# Patient Record
Sex: Female | Born: 1955
Health system: Southern US, Community
[De-identification: ages and names within clinical notes are randomized; demographics above are authoritative.]

## PROBLEM LIST (undated history)

## (undated) DIAGNOSIS — F32A Depression, unspecified: Secondary | ICD-10-CM

## (undated) DIAGNOSIS — I10 Essential (primary) hypertension: Secondary | ICD-10-CM

## (undated) DIAGNOSIS — M545 Low back pain, unspecified: Secondary | ICD-10-CM

## (undated) DIAGNOSIS — Z923 Personal history of irradiation: Secondary | ICD-10-CM

## (undated) DIAGNOSIS — F329 Major depressive disorder, single episode, unspecified: Secondary | ICD-10-CM

## (undated) DIAGNOSIS — R251 Tremor, unspecified: Secondary | ICD-10-CM

## (undated) DIAGNOSIS — L570 Actinic keratosis: Secondary | ICD-10-CM

## (undated) DIAGNOSIS — E7800 Pure hypercholesterolemia, unspecified: Secondary | ICD-10-CM

## (undated) DIAGNOSIS — W19XXXA Unspecified fall, initial encounter: Secondary | ICD-10-CM

## (undated) DIAGNOSIS — C50919 Malignant neoplasm of unspecified site of unspecified female breast: Secondary | ICD-10-CM

## (undated) DIAGNOSIS — F419 Anxiety disorder, unspecified: Secondary | ICD-10-CM

## (undated) DIAGNOSIS — G4700 Insomnia, unspecified: Secondary | ICD-10-CM

## (undated) HISTORY — DX: Depression, unspecified: F32.A

## (undated) HISTORY — DX: Tremor, unspecified: R25.1

## (undated) HISTORY — DX: Pure hypercholesterolemia, unspecified: E78.00

## (undated) HISTORY — DX: Anxiety disorder, unspecified: F41.9

## (undated) HISTORY — DX: Insomnia, unspecified: G47.00

## (undated) HISTORY — PX: REDUCTION MAMMAPLASTY: SUR839

## (undated) HISTORY — DX: Major depressive disorder, single episode, unspecified: F32.9

## (undated) HISTORY — DX: Essential (primary) hypertension: I10

## (undated) HISTORY — DX: Unspecified fall, initial encounter: W19.XXXA

## (undated) HISTORY — PX: BREAST LUMPECTOMY: SHX2

## (undated) HISTORY — DX: Low back pain, unspecified: M54.50

## (undated) HISTORY — DX: Actinic keratosis: L57.0

---
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
## 2000-05-12 ENCOUNTER — Emergency Department (HOSPITAL_COMMUNITY): Admission: RE | Admit: 2000-05-12 | Discharge: 2000-05-12 | Payer: Self-pay | Admitting: *Deleted

## 2000-05-12 ENCOUNTER — Encounter: Payer: Self-pay | Admitting: *Deleted

## 2000-12-14 ENCOUNTER — Other Ambulatory Visit: Admission: RE | Admit: 2000-12-14 | Discharge: 2000-12-14 | Payer: Self-pay | Admitting: Family Medicine

## 2002-09-16 ENCOUNTER — Encounter: Payer: Self-pay | Admitting: Family Medicine

## 2002-09-16 ENCOUNTER — Ambulatory Visit (HOSPITAL_COMMUNITY): Admission: RE | Admit: 2002-09-16 | Discharge: 2002-09-16 | Payer: Self-pay | Admitting: Family Medicine

## 2002-12-19 ENCOUNTER — Other Ambulatory Visit: Admission: RE | Admit: 2002-12-19 | Discharge: 2002-12-19 | Payer: Self-pay | Admitting: Family Medicine

## 2003-06-03 ENCOUNTER — Ambulatory Visit (HOSPITAL_COMMUNITY): Admission: RE | Admit: 2003-06-03 | Discharge: 2003-06-03 | Payer: Self-pay | Admitting: Orthopedic Surgery

## 2004-10-25 ENCOUNTER — Other Ambulatory Visit: Admission: RE | Admit: 2004-10-25 | Discharge: 2004-10-25 | Payer: Self-pay | Admitting: Family Medicine

## 2004-11-27 ENCOUNTER — Emergency Department (HOSPITAL_COMMUNITY): Admission: EM | Admit: 2004-11-27 | Discharge: 2004-11-27 | Payer: Self-pay | Admitting: Emergency Medicine

## 2008-04-22 ENCOUNTER — Encounter: Admission: RE | Admit: 2008-04-22 | Discharge: 2008-04-22 | Payer: Self-pay | Admitting: Family Medicine

## 2008-10-15 HISTORY — PX: SHOULDER SURGERY: SHX246

## 2008-10-18 ENCOUNTER — Inpatient Hospital Stay (HOSPITAL_COMMUNITY): Admission: EM | Admit: 2008-10-18 | Discharge: 2008-10-21 | Payer: Self-pay | Admitting: Emergency Medicine

## 2008-10-29 ENCOUNTER — Encounter (INDEPENDENT_AMBULATORY_CARE_PROVIDER_SITE_OTHER): Payer: Self-pay | Admitting: Orthopedic Surgery

## 2008-10-29 ENCOUNTER — Ambulatory Visit: Payer: Self-pay | Admitting: Surgery

## 2008-10-29 ENCOUNTER — Ambulatory Visit (HOSPITAL_COMMUNITY): Admission: RE | Admit: 2008-10-29 | Discharge: 2008-10-30 | Payer: Self-pay | Admitting: Orthopedic Surgery

## 2010-05-21 LAB — DIFFERENTIAL
Lymphocytes Relative: 23 % (ref 12–46)
Lymphs Abs: 1.5 10*3/uL (ref 0.7–4.0)
Monocytes Relative: 6 % (ref 3–12)
Neutrophils Relative %: 68 % (ref 43–77)

## 2010-05-21 LAB — BASIC METABOLIC PANEL
BUN: 9 mg/dL (ref 6–23)
BUN: 11 mg/dL (ref 6–23)
BUN: 12 mg/dL (ref 6–23)
CO2: 27 mEq/L (ref 19–32)
Calcium: 8.9 mg/dL (ref 8.4–10.5)
Chloride: 106 mEq/L (ref 96–112)
Creatinine, Ser: 0.64 mg/dL (ref 0.4–1.2)
Creatinine, Ser: 0.62 mg/dL (ref 0.4–1.2)
Creatinine, Ser: 0.66 mg/dL (ref 0.4–1.2)
GFR calc Af Amer: >60 mL/min (ref >60)
GFR calc non Af Amer: >60 mL/min (ref >60)
GFR calc non Af Amer: >60 mL/min (ref >60)
GFR calc non Af Amer: >60 mL/min (ref >60)
Glucose, Bld: 83 mg/dL (ref 70–99)
Glucose, Bld: 96 mg/dL (ref 70–99)
Potassium: 3.9 mEq/L (ref 3.5–5.1)
Potassium: 4.2 mEq/L (ref 3.5–5.1)

## 2010-05-21 LAB — PROTIME-INR
INR: 0.9 (ref 0.00–1.49)
Prothrombin Time: 12.3 seconds (ref 11.6–15.2)

## 2010-05-21 LAB — CBC
HCT: 33.4 % — ABNORMAL LOW (ref 36.0–46.0)
HCT: 33.6 % — ABNORMAL LOW (ref 36.0–46.0)
MCV: 107.7 fL — ABNORMAL HIGH (ref 78.0–100.0)
Platelets: 637 10*3/uL — ABNORMAL HIGH (ref 150–400)
Platelets: 657 10*3/uL — ABNORMAL HIGH (ref 150–400)
RBC: 3.12 MIL/uL — ABNORMAL LOW (ref 3.87–5.11)
RDW: 13.9 % (ref 11.5–15.5)
WBC: 8 10*3/uL (ref 4.0–10.5)
WBC: 6.5 10*3/uL (ref 4.0–10.5)

## 2010-05-21 LAB — TYPE AND SCREEN
ABO/RH(D): O NEG
Antibody Screen: NEGATIVE

## 2010-05-21 LAB — APTT: aPTT: 30 seconds (ref 24–37)

## 2010-07-02 NOTE — Consult Note (Signed)
NAMELORRANE, MCCAY NO.:  [id number]   MEDICAL RECORD NO.:  [id number]          PATIENT TYPE:  EMS   LOCATION:  MAJO                         FACILITY:  [facility]   PHYSICIAN:  Dionne Ano. Gramig III, M.D.DATE OF BIRTH:  05/07/1965   DATE OF CONSULTATION:  11/27/2004  DATE OF DISCHARGE:  11/27/2004                                   CONSULTATION   I had the pleasure to see Michele Swanson in the emergency room following  the kind referral from the Emergency Room Physician, Dr. Cathren Laine.  Ms.  [name] is a 55 year old right-hand dominant female who sustained an injury  to the left ring finger when her dog pulled her down.  She sustained a  dislocation of her finger, it became caught in a chain, twisted, and she  presented today with a highly deformed finger.  She denies other injury.   PAST MEDICAL HISTORY:  Depression and hypertension.   CURRENT MEDICATIONS:  1.  Cymbalta.  2.  Altace.   PAST SURGICAL HISTORY:  Cervical surgery (GYN surgery).   ALLERGIES:  None.   SOCIAL HISTORY:  The patient is separated, she smokes half pack of day, she  occasionally consumes alcohol.   EXAMINATION:  Reveals a female who is alert and oriented, no acute distress.  Vital signs stable.  The patient has a deformed finger about the ring finger  PIP joint, this is painful.  She has normal refill.  It is difficult to  assess her tendon exam due to pain.   I have gone ahead and performed an intermetacarpal block with lidocaine, she  tolerated this well, there were no complicating features.  Following this I  performed a manipulative reduction of the finger, she manipulated nicely  back into a reduced position.  Post reduction x-rays revealed a volar lip  fracture consistent with the dislocation.  She was stable in extension at 30  degrees, she had radial collateral ligament insufficiency indicating the  correct diagnosis of dislocation secondary to radial collateral ligament  tear.   The patient tolerated the procedure well.  Post reduction x-rays looked  excellent, except for the volar lip fracture which we will treat.  We are  going to place her in a dorsal blocking splint at night, beginning of range  of motion, buddy taping the ring finger to the middle finger and we  discharged her on Percocet and Robaxin.  She will return to the office to  see [place] next week for AP and lateral x-rays, and we will proceed accordingly.  It has been a pleasure to see her today, all questions have been encouraged  and answered.           ______________________________  Dionne Ano. Everlene Other, M.D.    Nash Mantis  D:  11/27/2004  T:  11/27/2004  Job:  [id number]

## 2014-01-13 ENCOUNTER — Other Ambulatory Visit: Payer: Self-pay | Admitting: Family Medicine

## 2014-01-13 ENCOUNTER — Other Ambulatory Visit (HOSPITAL_COMMUNITY)
Admission: RE | Admit: 2014-01-13 | Discharge: 2014-01-13 | Disposition: A | Discharge status: Home / Self Care | Payer: 59 | Source: Ambulatory Visit | Attending: Family Medicine | Admitting: Family Medicine

## 2014-01-13 DIAGNOSIS — Z01419 Encounter for gynecological examination (general) (routine) without abnormal findings: Secondary | ICD-10-CM | POA: Diagnosis present

## 2014-01-14 LAB — CYTOLOGY - PAP

## 2015-10-22 ENCOUNTER — Encounter: Payer: Self-pay | Admitting: *Deleted

## 2015-10-23 ENCOUNTER — Encounter: Payer: Self-pay | Admitting: Diagnostic Neuroimaging

## 2015-10-23 ENCOUNTER — Ambulatory Visit (INDEPENDENT_AMBULATORY_CARE_PROVIDER_SITE_OTHER): Payer: No Typology Code available for payment source | Admitting: Diagnostic Neuroimaging

## 2015-10-23 VITALS — BP 111/76 | HR 86 | Ht 65.0 in | Wt 143.0 lb

## 2015-10-23 DIAGNOSIS — R251 Tremor, unspecified: Secondary | ICD-10-CM | POA: Diagnosis not present

## 2015-10-23 DIAGNOSIS — F411 Generalized anxiety disorder: Secondary | ICD-10-CM

## 2015-10-23 NOTE — Progress Notes (Signed)
[facility]  PATIENT: Michele Swanson DOB: 06-20-1955  REFERRING CLINICIAN: Cipriano Mile HISTORY FROM: patient  REASON FOR VISIT: new consult    HISTORICAL  CHIEF COMPLAINT:  Chief Complaint  Patient presents with  . Tremors    rm 7, New Pt, "tremors x 2-3 mos, especially when trying to do something like write; may be due to Benicar?"    HISTORY OF PRESENT ILLNESS:   60 year old right-handed female here for evaluation of tremors. Patient reports at least 2-3 months of increasing tremors when holding a cup, holding a sandwich or doing other activities with her hands. Patient does not have any significant resting tremor. She reports mainly postural and action tremor. Symptoms have been present and a mild and intermittently for approximately one year but more significantly in the last 2-3 months. She denies any head, voice, lower extremity tremor. She denies any balance difficulty, choking, change in voice, change in sense of smell or taste. She does have a longer standing depression, anxiety, insomnia since the year 2000.  No family history of tremor except for maternal aunt with Parkinson's disease.   REVIEW OF SYSTEMS: Full 14 system review of systems performed and negative with exception of: Tremor easy bruising depression anxiety insomnia snoring allergies.  ALLERGIES: Allergies  Allergen Reactions  . Ambien [Zolpidem Tartrate] Other (See Comments)    Syncope episode  . Voltaren [Diclofenac Sodium] Nausea Only    HOME MEDICATIONS: No outpatient prescriptions prior to visit.   No facility-administered medications prior to visit.     PAST MEDICAL HISTORY: Past Medical History:  Diagnosis Date  . Anxiety   . Depression   . Hypercholesterolemia   . Hypertension   . Insomnia     PAST SURGICAL HISTORY: Past Surgical History:  Procedure Laterality Date  . SHOULDER SURGERY Right 10/2008   pinning    FAMILY HISTORY: Family History  Problem  Relation Age of Onset  . Dementia Mother   . Heart disease Father   . Atrial fibrillation Brother   . Heart disease Paternal Grandmother     SOCIAL HISTORY:  Social History   Social History  . Marital status: Married    Spouse name: N/A  . Number of children: 0  . Years of education: 12   Occupational History  .      retired, Warehouse manager   Social History Main Topics  . Smoking status: Current Every Day Smoker    Packs/day: 0.50    Types: Cigarettes  . Smokeless tobacco: Never Used     Comment: 10/23/15 trying to cut back  . Alcohol use No     Comment: socially  . Drug use: No  . Sexual activity: Not on file   Other Topics Concern  . Not on file   Social History Narrative   Lives alone, but is married   Caffeine - Coke, 1 daily     PHYSICAL EXAM  GENERAL EXAM/CONSTITUTIONAL: Vitals:  Vitals:   10/23/15 0935  BP: 111/76  Pulse: 86  Weight: 143 lb (64.9 kg)  Height: '5\' 5"'  (1.651 m)     Body mass index is 23.8 kg/m.  Visual Acuity Screening   Right eye Left eye Both eyes  Without correction: 20/70 20/70   With correction:     Comments: 10/23/15 didn't have glasses, &quot;need to see eye dr again&quot;    Patient is in no distress; well developed, nourished and groomed; neck is supple  CARDIOVASCULAR:  Examination of carotid arteries  is normal; no carotid bruits  Regular rate and rhythm, no murmurs  Examination of peripheral vascular system by observation and palpation is normal  EYES:  Ophthalmoscopic exam of optic discs and posterior segments is normal; no papilledema or hemorrhages  MUSCULOSKELETAL:  Gait, strength, tone, movements noted in Neurologic exam below  NEUROLOGIC: MENTAL STATUS:  No flowsheet data found.  awake, alert, oriented to person, place and time  recent and remote memory intact  normal attention and concentration  language fluent, comprehension intact, naming intact,   fund of knowledge  appropriate  CRANIAL NERVE:   2nd - no papilledema on fundoscopic exam  2nd, 3rd, 4th, 6th - pupils equal and reactive to light, visual fields full to confrontation, extraocular muscles intact, no nystagmus  5th - facial sensation symmetric  7th - facial strength symmetric  8th - hearing intact  9th - palate elevates symmetrically, uvula midline  11th - shoulder shrug symmetric  12th - tongue protrusion midline  MOTOR:   normal bulk and tone, full strength in the BUE, BLE  POSTURAL AND ACTION TREMOR IN BUE  MILD HEAD AND VOICE TREMOR  RARE REST TREMOR IN LUE  SENSORY:   normal and symmetric to light touch, temperature, vibration  COORDINATION:   finger-nose-finger, fine finger movements normal  REFLEXES:   deep tendon reflexes present and symmetric; SLIGHTLY BRISK IN LEGS   GAIT/STATION:   narrow based gait; able to walk tandem; romberg is negative    DIAGNOSTIC DATA (LABS, IMAGING, TESTING) - I reviewed patient records, labs, notes, testing and imaging myself where available.  Lab Results  Component Value Date   WBC 8.0 10/30/2008   HGB 11.4 (L) 10/30/2008   HCT 33.4 (L) 10/30/2008   MCV 108.0 (H) 10/30/2008   PLT 637 (H) 10/30/2008      Component Value Date/Time   NA 140 10/30/2008 0610   K 3.9 10/30/2008 0610   CL 107 10/30/2008 0610   CO2 25 10/30/2008 0610   GLUCOSE 83 10/30/2008 0610   BUN 9 10/30/2008 0610   CREATININE 0.64 10/30/2008 0610   CALCIUM 8.8 10/30/2008 0610   GFRNONAA >60 10/30/2008 0610   GFRAA  10/30/2008 0610    >60        The eGFR has been calculated using the MDRD equation. This calculation has not been validated in all clinical situations. eGFR's persistently <60 mL/min signify possible Chronic Kidney Disease.   No results found for: CHOL, HDL, LDLCALC, LDLDIRECT, TRIG, CHOLHDL No results found for: HGBA1C No results found for: VITAMINB12 No results found for: TSH      ASSESSMENT AND PLAN  60 y.o.  year old female here with gradual onset progressive postural and action tremor for past 1 year. Signs and symptoms most consistent with essential tremor versus metabolic or enhanced physiologic tremor. No rigidity, bradykinesia, postural instability to suggest Parkinson's disease at this time. Tremor is not significantly bothering patient at the present time with her activities of daily living or social life. Will proceed with further testing and monitor symptoms.   Ddx: essential tremor vs metabolic vs enhanced physiologic tremor  1. Tremor   2. Generalized anxiety disorder      PLAN: - add'l testing - may consider primidone in future for tremor control; will hold off for now as patient not that bothered by tremor and able to perform all ADLs at this time  - may consider sleep study in future for snoring, daytime fatigue and hypertension  Orders Placed This Encounter  Procedures  . MR Brain Wo Contrast  . TSH  . Vitamin B12   Return in about 3 months (around 01/22/2016).     Penni Bombard, MD 8/0/6386, 85:48 AM Certified in Neurology, Neurophysiology and Neuroimaging  [facility] [address] [place], [place] [address] [phone]

## 2015-10-23 NOTE — Patient Instructions (Addendum)

## 2015-10-24 LAB — TSH: TSH: 2.1 u[IU]/mL (ref 0.450–4.500)

## 2015-10-24 LAB — VITAMIN B12: Vitamin B-12: 293 pg/mL (ref 211–946)

## 2015-10-27 ENCOUNTER — Telehealth: Payer: Self-pay | Admitting: *Deleted

## 2015-10-27 NOTE — Telephone Encounter (Signed)
Per Dr Marjory LiesPenumalli, spoke with patient and informed her that her lab results are good. Advised her Dr Marjory LiesPenumalli will continue with her current treatment plan. Advised she will get a call to schedule MRI after insurance process is complete. She verbalized understanding, appreciation.

## 2015-11-06 ENCOUNTER — Telehealth: Payer: Self-pay | Admitting: Diagnostic Neuroimaging

## 2015-11-06 NOTE — Telephone Encounter (Signed)
Spoke with the patient regarding her insurance. She stated that she has new insurance through [employer], she said that they would not give her a card. She stated that the ID number was [id number]. Told her that I would try that number and call her back once I have an approval.

## 2015-12-08 NOTE — Telephone Encounter (Signed)
Per order notes patient is going to hold off on scheduling MRI due to financial reasons.

## 2015-12-15 ENCOUNTER — Telehealth: Payer: Self-pay | Admitting: Diagnostic Neuroimaging

## 2015-12-15 NOTE — Telephone Encounter (Signed)
Pt canceled 12/12 states waiting until new ins to r/s another appt.  cb

## 2015-12-16 NOTE — Telephone Encounter (Signed)
Noted. -VRP 

## 2016-01-26 ENCOUNTER — Ambulatory Visit: Payer: No Typology Code available for payment source | Admitting: Diagnostic Neuroimaging

## 2016-06-27 ENCOUNTER — Other Ambulatory Visit: Payer: Self-pay | Admitting: Family Medicine

## 2016-06-27 ENCOUNTER — Other Ambulatory Visit (HOSPITAL_COMMUNITY)
Admission: RE | Admit: 2016-06-27 | Discharge: 2016-06-27 | Disposition: A | Discharge status: Home / Self Care | Payer: No Typology Code available for payment source | Source: Ambulatory Visit | Attending: Family Medicine | Admitting: Family Medicine

## 2016-06-27 DIAGNOSIS — Z124 Encounter for screening for malignant neoplasm of cervix: Secondary | ICD-10-CM | POA: Diagnosis not present

## 2016-06-28 LAB — CYTOLOGY - PAP: Diagnosis: NEGATIVE

## 2018-07-25 ENCOUNTER — Other Ambulatory Visit: Payer: Self-pay | Admitting: Family Medicine

## 2018-07-25 DIAGNOSIS — E2839 Other primary ovarian failure: Secondary | ICD-10-CM

## 2019-01-24 ENCOUNTER — Encounter: Payer: Self-pay | Admitting: *Deleted

## 2019-01-25 ENCOUNTER — Other Ambulatory Visit: Payer: Self-pay

## 2019-01-25 ENCOUNTER — Encounter: Payer: Self-pay | Admitting: Diagnostic Neuroimaging

## 2019-01-25 ENCOUNTER — Ambulatory Visit: Payer: Medicare Other | Admitting: Diagnostic Neuroimaging

## 2019-01-25 VITALS — BP 123/70 | HR 90 | Temp 97.0°F | Ht 65.5 in | Wt 139.1 lb

## 2019-01-25 DIAGNOSIS — G252 Other specified forms of tremor: Secondary | ICD-10-CM

## 2019-01-25 DIAGNOSIS — R269 Unspecified abnormalities of gait and mobility: Secondary | ICD-10-CM | POA: Diagnosis not present

## 2019-01-25 NOTE — Progress Notes (Signed)
[facility]  PATIENT: Michele Swanson DOB: 1955/11/22  REFERRING CLINICIAN: Cipriano Swanson HISTORY FROM: patient  REASON FOR VISIT: new consult    HISTORICAL  CHIEF COMPLAINT:  Chief Complaint  Patient presents with  . Follow-up    Rm 7 pt's last visit was in 2017 for tremor- Pt has been referred back by Michele Ada, MD for the same issue  . Tremors    Pt reports tremors bilateral hands, reports stress and anxiety makes the tremors worse.  . Fall    Reports an increase in falls- ambulates with walker at all time.  . Back Pain    Lower back pain- pain with radiate to lower leg.     HISTORY OF PRESENT ILLNESS:   UPDATE (01/25/19, VRP): 63 year old female with gait diff and tremor. Since last visit, tremor slightly worse. More unsteadiness. Now with more LBP radiating to left leg; sometimes leg gives out. Some falls. Now using walker x 1 week. Poor appetite, eating once per day. Poor sleep.  PRIOR HPI (10/23/15): 63 year old right-handed female here for evaluation of tremors. Patient reports at least 2-3 months of increasing tremors when holding a cup, holding a sandwich or doing other activities with her hands. Patient does not have any significant resting tremor. She reports mainly postural and action tremor. Symptoms have been present and a mild and intermittently for approximately one year but more significantly in the last 2-3 months. She denies any head, voice, lower extremity tremor. She denies any balance difficulty, choking, change in voice, change in sense of smell or taste. She does have a longer standing depression, anxiety, insomnia since the year 2000.  No family history of tremor except for maternal aunt with Parkinson's disease.   REVIEW OF SYSTEMS: Full 14 system review of systems performed and negative with exception of: as per HPI.  ALLERGIES: Allergies  Allergen Reactions  . Ambien [Zolpidem Tartrate] Other (See Comments)    Syncope episode  .  Hydrocodone-Acetaminophen     Itching all over  . Voltaren [Diclofenac Sodium] Nausea Only    HOME MEDICATIONS: Outpatient Medications Prior to Visit  Medication Sig Dispense Refill  . amLODipine (NORVASC) 5 MG tablet Take 5 mg by mouth daily.    . Eszopiclone (ESZOPICLONE) 3 MG TABS Take 3 mg by mouth at bedtime. Take immediately before bedtime    . hydrochlorothiazide (HYDRODIURIL) 12.5 MG tablet Take 12.5 mg by mouth daily.    . Irbesartan (AVAPRO PO) irbesartan    . venlafaxine XR (EFFEXOR-XR) 150 MG 24 hr capsule venlafaxine ER 150 mg capsule,extended release 24 hr  Take 1 capsule every day by oral route.    Marland Kitchen amLODipine (NORVASC) 10 MG tablet Take 5 mg by mouth daily.     . Eszopiclone 3 MG TABS eszopiclone 3 mg tablet  Take 1 tablet every day by oral route.    . folic acid (FOLVITE) 097 MCG tablet Take 400 mcg by mouth daily.    . irbesartan (AVAPRO) 150 MG tablet Take 150 mg by mouth daily.    Marland Kitchen olmesartan (BENICAR) 20 MG tablet Take 20 mg by mouth daily.    Marland Kitchen venlafaxine XR (EFFEXOR-XR) 150 MG 24 hr capsule Take 150 mg by mouth daily with breakfast.    . vitamin B-12 (CYANOCOBALAMIN) 1000 MCG tablet Take 1,000 mcg by mouth daily.     No facility-administered medications prior to visit.    PAST MEDICAL HISTORY: Past Medical History:  Diagnosis Date  . Anxiety   .  Depression   . Fall   . Hypercholesterolemia   . Hypertension   . Insomnia   . Low back pain   . Tremor     PAST SURGICAL HISTORY: Past Surgical History:  Procedure Laterality Date  . SHOULDER SURGERY Right 10/2008   pinning    FAMILY HISTORY: Family History  Problem Relation Age of Onset  . Dementia Mother   . Heart disease Father   . Atrial fibrillation Brother   . Heart disease Paternal Grandmother     SOCIAL HISTORY:  Social History   Socioeconomic History  . Marital status: Married    Spouse name: Not on file  . Number of children: 0  . Years of education: 70  . 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 Smoking status: Current Every Day Smoker    Packs/day: 0.50    Types: Cigarettes  . Smokeless tobacco: Never Used  . Tobacco comment: 10/23/15 trying to cut back  Substance and Sexual Activity  . Alcohol use: No    Comment: socially  . Drug use: No  . Sexual activity: Not on file  Other Topics Concern  . Not on file  Social History Narrative   Lives alone, but is married   Caffeine - Coke, 1 daily   Right handed    Social Determinants of Health   Financial Resource Strain:   . Difficulty of Paying Living Expenses: Not on file  Food Insecurity:   . Worried About Charity fundraiser in the Last Year: Not on file  . Ran Out of Food in the Last Year: Not on file  Transportation Needs:   . Lack of Transportation (Medical): Not on file  . Lack of Transportation (Non-Medical): Not on file  Physical Activity:   . Days of Exercise per Week: Not on file  . Minutes of Exercise per Session: Not on file  Stress:   . Feeling of Stress : Not on file  Social Connections:   . Frequency of Communication with Friends and Family: Not on file  . Frequency of Social Gatherings with Friends and Family: Not on file  . Attends Religious Services: Not on file  . Active Member of Clubs or Organizations: Not on file  . Attends Archivist Meetings: Not on file  . Marital Status: Not on file  Intimate Partner Violence:   . Fear of Current or Ex-Partner: Not on file  . Emotionally Abused: Not on file  . Physically Abused: Not on file  . Sexually Abused: Not on file     PHYSICAL EXAM  GENERAL EXAM/CONSTITUTIONAL: Vitals:  Vitals:   01/25/19 0809  BP: 123/70  Pulse: 90  Temp: (!) 97 F (36.1 C)  TempSrc: Temporal  Weight: 139 lb 2 oz (63.1 kg)  Height: 5' 5.5" (1.664 m)   Body mass index is 22.8 kg/m. No exam data present  Patient is in no distress; well developed, nourished  and groomed; neck is supple  CARDIOVASCULAR:  Examination of carotid arteries is normal; no carotid bruits  Regular rate and rhythm, no murmurs  Examination of peripheral vascular system by observation and palpation is normal  EYES:  Ophthalmoscopic exam of optic discs and posterior segments is normal; no papilledema or hemorrhages  MUSCULOSKELETAL:  Gait, strength, tone, movements noted in Neurologic exam below  NEUROLOGIC: MENTAL STATUS:  No flowsheet data found.  awake, alert, oriented to person,  place and time  recent and remote memory intact  normal attention and concentration  language fluent, comprehension intact, naming intact,   fund of knowledge appropriate  CRANIAL NERVE:   2nd - no papilledema on fundoscopic exam  2nd, 3rd, 4th, 6th - pupils equal and reactive to light, visual fields full to confrontation, extraocular muscles intact, no nystagmus  5th - facial sensation symmetric  7th - facial strength symmetric  8th - hearing intact  9th - palate elevates symmetrically, uvula midline  11th - shoulder shrug symmetric  12th - tongue protrusion midline  MOTOR:   normal bulk and tone, full strength in the BUE, BLE  POSTURAL AND ACTION TREMOR IN BUE  MILD HEAD AND VOICE TREMOR  NO REST TREMOR; NO BRADYKINESIA; NO RIGIDITY  SENSORY:   normal and symmetric to light touch, temperature, vibration  COORDINATION:   finger-nose-finger, fine finger movements normal  REFLEXES:   deep tendon reflexes --> SLIGHTLY BRISK IN LUE AND BLE; NEG HOFFMANS  GAIT/STATION:   WIDE BASED GAIT; VERY UNSTEADY / ATAXIC GAIT    DIAGNOSTIC DATA (LABS, IMAGING, TESTING) - I reviewed patient records, labs, notes, testing and imaging myself where available.  Lab Results  Component Value Date   WBC 8.0 10/30/2008   HGB 11.4 (L) 10/30/2008   HCT 33.4 (L) 10/30/2008   MCV 108.0 (H) 10/30/2008   PLT 637 (H) 10/30/2008      Component Value Date/Time   NA  140 10/30/2008 0610   K 3.9 10/30/2008 0610   CL 107 10/30/2008 0610   CO2 25 10/30/2008 0610   GLUCOSE 83 10/30/2008 0610   BUN 9 10/30/2008 0610   CREATININE 0.64 10/30/2008 0610   CALCIUM 8.8 10/30/2008 0610   GFRNONAA >60 10/30/2008 0610   GFRAA  10/30/2008 0610    >60        The eGFR has been calculated using the MDRD equation. This calculation has not been validated in all clinical situations. eGFR's persistently <60 mL/min signify possible Chronic Kidney Disease.   No results found for: CHOL, HDL, LDLCALC, LDLDIRECT, TRIG, CHOLHDL No results found for: HGBA1C Lab Results  Component Value Date   VITAMINB12 293 10/23/2015   Lab Results  Component Value Date   TSH 2.100 10/23/2015        ASSESSMENT AND PLAN  63 y.o. year old female here with:    Dx:   1. Gait difficulty   2. Postural tremor    PLAN:  GAIT DIFFICULTY (frequent falls; low back pain radiating to left leg) - check MRI cervical and lumbar spine (rule out cervical myelopathy and lumbar spinal stenosis) - check labs (b12, tsh, a1c) - may consider MRI brain in future - refer to PT evaluation  POSTURAL / ACTION TREMOR (essential tremor vs anxiety tremor vs medication tremor) - may consider primidone in future for tremor control  Orders Placed This Encounter  Procedures  . MR CERVICAL SPINE WO CONTRAST  . MR LUMBAR SPINE WO CONTRAST  . Vitamin B12  . Hemoglobin A1c  . TSH  . CBC with Differential/Platelet  . Comprehensive metabolic panel  . Home Health  . Face-to-face encounter (required for Medicare/Medicaid patients)   Return pending test results.     Penni Bombard, MD 24/23/5361, 4:43 AM Certified in Neurology, Neurophysiology and Neuroimaging  [facility] [address], [place], [place] [address] [phone]

## 2019-01-25 NOTE — Patient Instructions (Signed)
GAIT DIFFICULTY - check MRI cervical and lumbar spine  - check labs (b12, tsh, a1c) - may consider MRI brain in future - refer to PT evaluation  POSTURAL / ACTION TREMOR (essential tremor vs anxiety tremor vs medication tremor) - may consider primidone in future for tremor control

## 2019-01-25 NOTE — Addendum Note (Signed)
Addended by: Inis Sizer D on: 01/25/2019 09:21 AM   Modules accepted: Orders

## 2019-01-26 LAB — COMPREHENSIVE METABOLIC PANEL
ALT: 10 IU/L (ref 0–32)
AST: 12 IU/L (ref 0–40)
Albumin/Globulin Ratio: 1.7 (ref 1.2–2.2)
Albumin: 3.9 g/dL (ref 3.8–4.8)
Alkaline Phosphatase: 118 IU/L — ABNORMAL HIGH (ref 39–117)
BUN/Creatinine Ratio: 28 (ref 12–28)
BUN: 21 mg/dL (ref 8–27)
Bilirubin Total: 0.2 mg/dL (ref 0.0–1.2)
CO2: 23 mmol/L (ref 20–29)
Calcium: 9.5 mg/dL (ref 8.7–10.3)
Chloride: 103 mmol/L (ref 96–106)
Creatinine, Ser: 0.76 mg/dL (ref 0.57–1.00)
GFR calc Af Amer: 95 mL/min/{1.73_m2} (ref >59)
GFR calc non Af Amer: 83 mL/min/{1.73_m2} (ref >59)
Globulin, Total: 2.3 g/dL (ref 1.5–4.5)
Glucose: 104 mg/dL — ABNORMAL HIGH (ref 65–99)
Potassium: 4.5 mmol/L (ref 3.5–5.2)
Sodium: 142 mmol/L (ref 134–144)
Total Protein: 6.2 g/dL (ref 6.0–8.5)

## 2019-01-26 LAB — CBC WITH DIFFERENTIAL/PLATELET
Basophils Absolute: 0 10*3/uL (ref 0.0–0.2)
Basos: 0 %
EOS (ABSOLUTE): 0 10*3/uL (ref 0.0–0.4)
Eos: 0 %
Hematocrit: 38.7 % (ref 34.0–46.6)
Hemoglobin: 13.2 g/dL (ref 11.1–15.9)
Immature Grans (Abs): 0 10*3/uL (ref 0.0–0.1)
Immature Granulocytes: 0 %
Lymphocytes Absolute: 1.3 10*3/uL (ref 0.7–3.1)
Lymphs: 14 %
MCH: 35.3 pg — ABNORMAL HIGH (ref 26.6–33.0)
MCHC: 34.1 g/dL (ref 31.5–35.7)
MCV: 104 fL — ABNORMAL HIGH (ref 79–97)
Monocytes Absolute: 0.6 10*3/uL (ref 0.1–0.9)
Monocytes: 7 %
Neutrophils Absolute: 7.4 10*3/uL — ABNORMAL HIGH (ref 1.4–7.0)
Neutrophils: 79 %
Platelets: 324 10*3/uL (ref 150–450)
RBC: 3.74 x10E6/uL — ABNORMAL LOW (ref 3.77–5.28)
RDW: 12.8 % (ref 11.7–15.4)
WBC: 9.4 10*3/uL (ref 3.4–10.8)

## 2019-01-26 LAB — TSH: TSH: 2.23 u[IU]/mL (ref 0.450–4.500)

## 2019-01-26 LAB — HEMOGLOBIN A1C
Est. average glucose Bld gHb Est-mCnc: 108 mg/dL
Hgb A1c MFr Bld: 5.4 % (ref 4.8–5.6)

## 2019-01-26 LAB — VITAMIN B12: Vitamin B-12: 1642 pg/mL — ABNORMAL HIGH (ref 232–1245)

## 2019-01-29 ENCOUNTER — Telehealth: Payer: Self-pay | Admitting: Diagnostic Neuroimaging

## 2019-01-29 NOTE — Telephone Encounter (Signed)
01/29/2019 Called patient's husband and he relayed that patient has switched to [facility] Patient husband relayed he will call me back with information 02/04/2019  I need to check for PA for MRI's Jayme Cloud

## 2019-02-04 ENCOUNTER — Telehealth: Payer: Self-pay | Admitting: *Deleted

## 2019-02-04 NOTE — Telephone Encounter (Signed)
Pts husband called and stated January 2020 pt will be switching to [facility]  Member ID : [id number]  Group ID : [id number]

## 2019-02-04 NOTE — Telephone Encounter (Signed)
Spoke with patient and informed her that her labs are unremarkable . Continue current plan of MRIs in Jan. Patient verbalized understanding, appreciation.

## 2019-02-14 ENCOUNTER — Other Ambulatory Visit: Payer: Self-pay | Admitting: Diagnostic Neuroimaging

## 2019-02-16 ENCOUNTER — Other Ambulatory Visit: Payer: No Typology Code available for payment source

## 2019-02-18 NOTE — Telephone Encounter (Signed)
I called to check status of the patients authorization request, the representative stated that the Lumbar has been approved and the Cervical is pending clinical review. I spoke with nurse reviewer who approved the cases Authorization [id number] (Feb 3rd 2021).   I called the patients husband to make him aware but he did not answer so I left him a VM asking him to call me back. DWD

## 2019-02-18 NOTE — Telephone Encounter (Signed)
I called the patients husband and made him aware. DWD

## 2019-02-18 NOTE — Telephone Encounter (Signed)
I have submitted pre-auth for MRI's ordered by Dr. Marjory Lies and they are requiring clinical review. I have faxed clinical notes to [facility] at [phone]. Phone: [phone], Tracking# [id number].

## 2019-02-22 ENCOUNTER — Ambulatory Visit
Admission: RE | Admit: 2019-02-22 | Discharge: 2019-02-22 | Disposition: A | Discharge status: Home / Self Care | Payer: Medicare PPO | Source: Ambulatory Visit | Attending: Diagnostic Neuroimaging | Admitting: Diagnostic Neuroimaging

## 2019-02-22 ENCOUNTER — Other Ambulatory Visit: Payer: Self-pay

## 2019-02-22 DIAGNOSIS — R269 Unspecified abnormalities of gait and mobility: Secondary | ICD-10-CM | POA: Diagnosis not present

## 2019-03-05 ENCOUNTER — Telehealth: Payer: Self-pay | Admitting: *Deleted

## 2019-03-05 DIAGNOSIS — R269 Unspecified abnormalities of gait and mobility: Secondary | ICD-10-CM

## 2019-03-05 DIAGNOSIS — M545 Low back pain, unspecified: Secondary | ICD-10-CM

## 2019-03-05 DIAGNOSIS — R937 Abnormal findings on diagnostic imaging of other parts of musculoskeletal system: Secondary | ICD-10-CM

## 2019-03-05 DIAGNOSIS — R296 Repeated falls: Secondary | ICD-10-CM

## 2019-03-05 NOTE — Telephone Encounter (Addendum)
Reached patient and informed her MRI cervical spine showed mild degenerative / arthritis changes in her neck. He advises to continue conservative management. Her MRI lumbar spine showed mild-moderate spinal stenosis at L3-4, L4-5. He advised she consider spine surgery evaluation / consult. She will discuss with husband and let us know. Patient verbalized understanding, appreciation.

## 2019-03-05 NOTE — Telephone Encounter (Signed)
LVM requesting call back for MRI results. 

## 2019-03-05 NOTE — Telephone Encounter (Signed)
Patient called back in regards to missed call for results.  Please follow up 

## 2019-03-06 ENCOUNTER — Telehealth: Payer: Self-pay | Admitting: Diagnostic Neuroimaging

## 2019-03-06 NOTE — Telephone Encounter (Addendum)
Patient's husband called wanting to know what the next step is now that the patient has Spinal Stenosis. I called husband, Ron who had multiple questions. I answered to his stated satisfaction. The patient and husband would like her to be referred to a spine surgeon. I advised Dr Marjory Lies will put in referral. If they haven't heard in a week they may call to check on referral. He verbalized understanding, appreciation. Referral placed.

## 2019-03-06 NOTE — Telephone Encounter (Signed)
Please see phone note dated 03/05/19.

## 2019-03-06 NOTE — Addendum Note (Signed)
Addended by: [place] Pink on: 03/06/2019 04:21 PM   Modules accepted: Orders

## 2019-03-06 NOTE — Telephone Encounter (Signed)
Pt's husband called wanting to know what the next step is now that it is diagnosed that the pt has Spinal Stenosis. Please advise.

## 2019-03-12 NOTE — Telephone Encounter (Signed)
Noted Referral has been faxed.

## 2019-04-16 ENCOUNTER — Other Ambulatory Visit: Payer: Self-pay | Admitting: Neurosurgery

## 2019-04-16 DIAGNOSIS — R269 Unspecified abnormalities of gait and mobility: Secondary | ICD-10-CM

## 2019-05-11 ENCOUNTER — Ambulatory Visit
Admission: RE | Admit: 2019-05-11 | Discharge: 2019-05-11 | Disposition: A | Discharge status: Home / Self Care | Payer: Medicare PPO | Source: Ambulatory Visit | Attending: Neurosurgery | Admitting: Neurosurgery

## 2019-05-11 DIAGNOSIS — R269 Unspecified abnormalities of gait and mobility: Secondary | ICD-10-CM

## 2019-05-27 ENCOUNTER — Encounter: Payer: Self-pay | Admitting: Neurology

## 2019-05-27 ENCOUNTER — Other Ambulatory Visit: Payer: Self-pay | Admitting: Family Medicine

## 2019-05-27 DIAGNOSIS — R222 Localized swelling, mass and lump, trunk: Secondary | ICD-10-CM

## 2019-05-27 DIAGNOSIS — N1330 Unspecified hydronephrosis: Secondary | ICD-10-CM

## 2019-05-30 ENCOUNTER — Other Ambulatory Visit: Payer: Self-pay | Admitting: Family Medicine

## 2019-05-30 DIAGNOSIS — N1330 Unspecified hydronephrosis: Secondary | ICD-10-CM

## 2019-05-30 DIAGNOSIS — R222 Localized swelling, mass and lump, trunk: Secondary | ICD-10-CM

## 2019-06-11 ENCOUNTER — Ambulatory Visit
Admission: RE | Admit: 2019-06-11 | Discharge: 2019-06-11 | Disposition: A | Discharge status: Home / Self Care | Payer: Medicare PPO | Source: Ambulatory Visit | Attending: Family Medicine | Admitting: Family Medicine

## 2019-06-11 DIAGNOSIS — R222 Localized swelling, mass and lump, trunk: Secondary | ICD-10-CM

## 2019-06-11 DIAGNOSIS — N1330 Unspecified hydronephrosis: Secondary | ICD-10-CM

## 2019-06-11 MED ORDER — IOPAMIDOL (ISOVUE-300) INJECTION 61%
100.0000 mL | Freq: Once | INTRAVENOUS | Status: AC | PRN
  Administered 2019-06-11: 100 mL via INTRAVENOUS

## 2019-06-20 NOTE — Progress Notes (Signed)
Assessment/Plan:     1.  Tremor  -I do think that she has essential tremor.  We talked about the nature and pathophysiology.  We talked about various treatments and ultimately decided on primidone.  She will start at half tablet at night and slowly work up to 1 tablet nightly.  We may need to go further than this in the future.  Discussed risk, benefits, and side effects.  Patient expressed understanding.  -Long discussion with the patient regarding the complex effect that alcohol can have on tremor.  Chronic alcohol use can produce a tremor but discontinuation of the alcohol can also cause a tremulous state for quite some time.  We talked about the importance of weaning alcohol under medical supervision.  Recommended that she at least try to get alcohol down to no more than 2 days/week.  She was agreeable.  2.  Ataxia  -It is believed that ET alone can cause ataxia/balance changes due to changes in the cerebellar purkinje cell/climbing fiber synaptic transmission.  However, I do think her balance change is out of proportion to what we can blame on ET alone.  check PN labs - folate, rpr, spep/upep/hgbA1c  -We will do a complete ataxia evaluation through [facility].  -If the above is negative, then we will do an EMG.  I did go ahead and put that tentatively on the schedule today.  -Patient will continue with her walker at all times.  3.  Follow-up after the above is completed.  Subjective:   Michele Swanson was seen today in the movement disorders clinic for neurologic consultation at the request of [name], MD.  The consultation is for the evaluation of tremor and balance trouble.  This is a 3rd opinion.  She has been seeing Dr. Marjory Lies for this and those records are reviewed.  She has apparently gotten an opinion on her back from Dr. Franky Macho but I don't have those records.  Patient has been seeing Dr. Marjory Lies since 2017.  Her first visit with him was in September, 2017 when she was  complaining about tremor that started a year before that.  At that point in time, Dr. Marjory Lies felt that she had either essential tremor or enhanced physiologic tremor as well as generalized anxiety disorder.  He recommended an MRI of the brain but that was declined because of cost.  She came back in December, 2020 with the same complaints, that being tremor and balance change/falls/back pain.  MRI brain/lumbar/cervical spine was completed.  I did not review her MRI of the lumbar or cervical spine personally.  I did review her MRI of the brain.  There was mild small vessel disease.  Tremor: Yes.     How long has it been going on? "I don't really notice it - others do." - in records since 2017 and they state that had it one year prior  At rest or with activation?  activation  When is it noted the most?  Writing, standing up "my whole body may shake."  Fam hx of tremor?  Maternal aunt with Parkinson's disease  Affected by caffeine:  No. (drinks  12-24 oz coke per day)  Affected by alcohol:  "I haven't noticed" (1-2 glasses wine per day)  Affected by stress:  Yes.    Affected by fatigue:  Yes.    Spills soup if on spoon:  No. , but "its hard"  Affects ADL's (tying shoes, brushing teeth, etc):  No.   Other Specific Symptoms:  Voice: no change Sleep: sleeps well with "sleeping pills" (lunesta)  Vivid Dreams:  Yes.    Acting out dreams:  No. Wet Pillows: No. Postural symptoms:  Yes.   since 2016-2017.  Started using walker x few months  Falls?  Yes.  , none with the walker.  Usually it seems like the L foot won't move and she will fall to the R.  Before the walker, she would have a fall a few times per week to 1 time qoweek Bradykinesia symptoms: shuffling gait and slow movements Loss of smell:  No. Loss of taste:  "off and on" Urinary Incontinence:  No. Difficulty Swallowing:  No. Handwriting, micrographia: No. Trouble with ADL's:  No.  Trouble buttoning clothing: No. Depression:  Some and  describes anxiety.  Noted that when effexor was increased to 75 mg, 3 per day, she was jerking all over and they had to decreased the dosage.  They have backed down to 150 mg per day Memory changes:  No. N/V:  No. Lightheaded:  rarely  Syncope: No. Diplopia:  No. Dyskinesia:  No.  PREVIOUS MEDICATIONS: none to date  ALLERGIES:   Allergies  Allergen Reactions  . Ambien [Zolpidem Tartrate] Other (See Comments)    Syncope episode  . Hydrocodone-Acetaminophen     Itching all over  . Voltaren [Diclofenac Sodium] Nausea Only    CURRENT MEDICATIONS:  Current Outpatient Medications  Medication Instructions  . amLODipine (NORVASC) 5 mg, Oral, Daily  . amLODipine (NORVASC) 5 mg, Oral, Daily  . Coenzyme Q10 (COQ10) 100 MG CAPS Oral  . Eszopiclone (ESZOPICLONE) 3 mg, Oral, Daily at bedtime, Take immediately before bedtime   . Eszopiclone 3 MG TABS eszopiclone 3 mg tablet  Take 1 tablet every day by oral route.  . folic acid (FOLVITE) 865 mcg, Oral, Daily  . hydrochlorothiazide (HYDRODIURIL) 12.5 mg, Oral, Daily  . Irbesartan (AVAPRO PO) irbesartan  . irbesartan (AVAPRO) 150 mg, Oral, Daily  . olmesartan (BENICAR) 20 mg, Oral, Daily  . venlafaxine XR (EFFEXOR-XR) 150 MG 24 hr capsule 75 mg.   . vitamin B-12 (CYANOCOBALAMIN) 1,000 mcg, Oral, Daily    Objective:   PHYSICAL EXAMINATION:    VITALS:   Vitals:   06/24/19 1223  BP: 113/75  Pulse: 91  Resp: 20  SpO2: 98%  Weight: 135 lb (61.2 kg)  Height: 5\' 5"  (1.651 m)    GEN:  The patient appears stated age and is in NAD. HEENT:  Normocephalic, atraumatic.  The mucous membranes are moist. The superficial temporal arteries are without ropiness or tenderness. CV:  RRR Lungs:  CTAB Neck/HEME:  There are no carotid bruits bilaterally.  Patient was undressed and placed into the examining shorts for neuro exam.  Neurological examination:  Orientation: The patient is alert and oriented x3.  Cranial nerves: There is good facial  symmetry.  Extraocular muscles are intact. The visual fields are full to confrontational testing. The speech is fluent and clear. Soft palate rises symmetrically and there is no tongue deviation. Hearing is intact to conversational tone. Sensation: Sensation is intact to light touch throughout (facial, trunk, extremities). Vibration is intact at the bilateral big toe. There is no extinction with double simultaneous stimulation.  Motor: Strength is 5/5 in the bilateral upper and lower extremities.   Shoulder shrug is equal and symmetric.  There is no pronator drift.  No fasciculations in the arms, legs, trunk Deep tendon reflexes: Deep tendon reflexes are 2+-3/4 at the bilateral biceps, triceps, brachioradialis, patella and achilles.  Plantar responses are downgoing bilaterally.  Movement examination: Tone: There is normal tone in the bilateral upper extremities.  The tone in the lower extremities is normal.  Abnormal movements: Patient did have some head tremor in the "yes" direction.  She had leg tremor.  She had tremor of the outstretched hands that increased with intention.  She had tremor with Archimedes spirals. Coordination:  There is no decremation with RAM's Gait and Station: The patient pushes off to arise.  She is wide based and very ataxic.  She is good with the walker but very off balanced without it.   I have reviewed and interpreted the following labs independently   Chemistry      Component Value Date/Time   NA 142 01/25/2019 0925   K 4.5 01/25/2019 0925   CL 103 01/25/2019 0925   CO2 23 01/25/2019 0925   BUN 21 01/25/2019 0925   CREATININE 0.76 01/25/2019 0925      Component Value Date/Time   CALCIUM 9.5 01/25/2019 0925   ALKPHOS 118 (H) 01/25/2019 0925   AST 12 01/25/2019 0925   ALT 10 01/25/2019 0925   BILITOT 0.2 01/25/2019 0925      Lab Results  Component Value Date   TSH 2.230 01/25/2019   Lab Results  Component Value Date   WBC 9.4 01/25/2019   HGB 13.2  01/25/2019   HCT 38.7 01/25/2019   MCV 104 (H) 01/25/2019   PLT 324 01/25/2019    Lab Results  Component Value Date   VITAMINB12 1,642 (H) 01/25/2019     Total time spent on today's visit was 60 minutes, including both face-to-face time and nonface-to-face time.  Time included that spent on review of records (prior notes available to me/labs/imaging if pertinent), discussing treatment and goals, answering patient's questions and coordinating care.  Cc:  [name], MD

## 2019-06-24 ENCOUNTER — Other Ambulatory Visit: Payer: Medicare PPO

## 2019-06-24 ENCOUNTER — Encounter: Payer: Self-pay | Admitting: Neurology

## 2019-06-24 ENCOUNTER — Ambulatory Visit: Payer: Medicare PPO | Admitting: Neurology

## 2019-06-24 ENCOUNTER — Other Ambulatory Visit: Payer: Self-pay

## 2019-06-24 VITALS — BP 113/75 | HR 91 | Resp 20 | Ht 65.0 in | Wt 135.0 lb

## 2019-06-24 DIAGNOSIS — R270 Ataxia, unspecified: Secondary | ICD-10-CM | POA: Diagnosis not present

## 2019-06-24 DIAGNOSIS — G609 Hereditary and idiopathic neuropathy, unspecified: Secondary | ICD-10-CM

## 2019-06-24 DIAGNOSIS — Z5181 Encounter for therapeutic drug level monitoring: Secondary | ICD-10-CM | POA: Diagnosis not present

## 2019-06-24 DIAGNOSIS — R251 Tremor, unspecified: Secondary | ICD-10-CM

## 2019-06-24 DIAGNOSIS — R739 Hyperglycemia, unspecified: Secondary | ICD-10-CM

## 2019-06-24 LAB — HEMOGLOBIN A1C: Hgb A1c MFr Bld: 5.6 % (ref 4.6–6.5)

## 2019-06-24 LAB — FOLATE: Folate: 11.5 ng/mL (ref >5.9)

## 2019-06-24 MED ORDER — PRIMIDONE 50 MG PO TABS: 50.0000 mg | ORAL_TABLET | Freq: Every day | ORAL | 1 refills | Status: DC

## 2019-06-24 NOTE — Patient Instructions (Addendum)
1.  Start primidone 50 mg - 1/2 tablet at bedtime for 1 week and then increase to 1 tablet at bedtime thereafter.  This may not be enough medication to help tremor but we will see how you do 2.  We will try to do the genetic testing for ataxia/balance change 3.  Decrease alcohol intake to no more than 2 days per week 4.  Your provider has requested that you have labwork completed today. Please go to [facility] ([id number]) on the second floor of this building before leaving the office today. You do not need to check in. If you are not called within 15 minutes please check with the front desk.   ELECTROMYOGRAM AND NERVE CONDUCTION STUDIES (EMG/NCS) INSTRUCTIONS  How to Prepare The neurologist conducting the EMG will need to know if you have certain medical conditions. Tell the neurologist and other EMG lab personnel if you: . Have a pacemaker or any other electrical medical device . Take blood-thinning medications . Have hemophilia, a blood-clotting disorder that causes prolonged bleeding Bathing Take a shower or bath shortly before your exam in order to remove oils from your skin. Don't apply lotions or creams before the exam.  What to Expect You'll likely be asked to change into a hospital gown for the procedure and lie down on an examination table. The following explanations can help you understand what will happen during the exam.  . Electrodes. The neurologist or a technician places surface electrodes at various locations on your skin depending on where you're experiencing symptoms. Or the neurologist may insert needle electrodes at different sites depending on your symptoms.  . Sensations. The electrodes will at times transmit a tiny electrical current that you may feel as a twinge or spasm. The needle electrode may cause discomfort or pain that usually ends shortly after the needle is removed. If you are concerned about discomfort or pain, you may want to talk to the neurologist  about taking a short break during the exam.  . Instructions. During the needle EMG, the neurologist will assess whether there is any spontaneous electrical activity when the muscle is at rest - activity that isn't present in healthy muscle tissue - and the degree of activity when you slightly contract the muscle.  He or she will give you instructions on resting and contracting a muscle at appropriate times. Depending on what muscles and nerves the neurologist is examining, he or she may ask you to change positions during the exam.  After your EMG You may experience some temporary, minor bruising where the needle electrode was inserted into your muscle. This bruising should fade within several days. If it persists, contact your primary care doctor.

## 2019-06-26 LAB — IMMUNOFIXATION ELECTROPHORESIS
IgG (Immunoglobin G), Serum: 800 mg/dL (ref 600–1540)
IgM, Serum: 141 mg/dL (ref 50–300)
Immunofix Electr Int: NOT DETECTED
Immunoglobulin A: 265 mg/dL (ref 70–320)

## 2019-06-26 LAB — PROTEIN ELECTROPHORESIS, SERUM
Albumin ELP: 4.2 g/dL (ref 3.8–4.8)
Alpha 1: 0.3 g/dL (ref 0.2–0.3)
Alpha 2: 0.8 g/dL (ref 0.5–0.9)
Beta 2: 0.4 g/dL (ref 0.2–0.5)
Beta Globulin: 0.4 g/dL (ref 0.4–0.6)
Gamma Globulin: 0.8 g/dL (ref 0.8–1.7)
Total Protein: 7 g/dL (ref 6.1–8.1)

## 2019-06-26 LAB — RPR: RPR Ser Ql: NONREACTIVE

## 2019-07-22 DIAGNOSIS — Z0000 Encounter for general adult medical examination without abnormal findings: Secondary | ICD-10-CM | POA: Diagnosis not present

## 2019-07-22 DIAGNOSIS — Z23 Encounter for immunization: Secondary | ICD-10-CM | POA: Diagnosis not present

## 2019-07-22 DIAGNOSIS — E2839 Other primary ovarian failure: Secondary | ICD-10-CM | POA: Diagnosis not present

## 2019-07-22 DIAGNOSIS — E538 Deficiency of other specified B group vitamins: Secondary | ICD-10-CM | POA: Diagnosis not present

## 2019-07-22 DIAGNOSIS — R718 Other abnormality of red blood cells: Secondary | ICD-10-CM | POA: Diagnosis not present

## 2019-07-22 DIAGNOSIS — R7309 Other abnormal glucose: Secondary | ICD-10-CM | POA: Diagnosis not present

## 2019-07-22 DIAGNOSIS — I10 Essential (primary) hypertension: Secondary | ICD-10-CM | POA: Diagnosis not present

## 2019-07-22 DIAGNOSIS — G4700 Insomnia, unspecified: Secondary | ICD-10-CM | POA: Diagnosis not present

## 2019-07-22 DIAGNOSIS — E782 Mixed hyperlipidemia: Secondary | ICD-10-CM | POA: Diagnosis not present

## 2019-07-22 DIAGNOSIS — E559 Vitamin D deficiency, unspecified: Secondary | ICD-10-CM | POA: Diagnosis not present

## 2019-07-31 DIAGNOSIS — E875 Hyperkalemia: Secondary | ICD-10-CM | POA: Diagnosis not present

## 2019-08-12 ENCOUNTER — Telehealth: Payer: Self-pay | Admitting: Neurology

## 2019-08-12 NOTE — Telephone Encounter (Signed)
Patient states she had bloodwork done about a month ago and hasn't heard what the results are. Please call.

## 2019-08-12 NOTE — Telephone Encounter (Signed)
Spoke with patient and she voiced understanding.

## 2019-08-12 NOTE — Telephone Encounter (Signed)
They look good.

## 2019-08-16 ENCOUNTER — Telehealth: Payer: Self-pay | Admitting: Neurology

## 2019-08-16 NOTE — Telephone Encounter (Signed)
Please let pt know that hereditary ataxia evaluation that we did through [facility] diagnositic lab was negative/normal, which is good news.  We will await EMG testing

## 2019-08-20 NOTE — Telephone Encounter (Signed)
Patient notified and voiced understanding. Patient stated she is having her EMG 09/24/2019.

## 2019-09-24 ENCOUNTER — Ambulatory Visit (INDEPENDENT_AMBULATORY_CARE_PROVIDER_SITE_OTHER): Payer: Medicare PPO | Admitting: Neurology

## 2019-09-24 ENCOUNTER — Other Ambulatory Visit: Payer: Self-pay

## 2019-09-24 DIAGNOSIS — Z5181 Encounter for therapeutic drug level monitoring: Secondary | ICD-10-CM

## 2019-09-24 DIAGNOSIS — R251 Tremor, unspecified: Secondary | ICD-10-CM

## 2019-09-24 DIAGNOSIS — R270 Ataxia, unspecified: Secondary | ICD-10-CM

## 2019-09-24 DIAGNOSIS — R739 Hyperglycemia, unspecified: Secondary | ICD-10-CM

## 2019-09-24 DIAGNOSIS — G609 Hereditary and idiopathic neuropathy, unspecified: Secondary | ICD-10-CM

## 2019-09-24 NOTE — Procedures (Signed)
[facility]  [address] [place], [address]  [place], [place] [address] Tel: [phone] Fax:  [phone] Test Date:  09/24/2019  Patient: Michele Swanson DOB: [birth year] Physician: Nita Sickle, DO  Sex: Female Height: 5\' 5"  Ref Phys: , D.O.  ID#: Kerin Salen Temp: 32.0C Technician:    Patient Complaints: This is a 64 year old female referred for evaluation of gait instability.  NCV & EMG Findings: Extensive electrodiagnostic testing of the right lower extremity and additional studies of the left shows:  1. Bilateral sural and superficial peroneal sensory responses are within normal limits. 2. Bilateral peroneal and tibial motor responses are within normal limits. 3. Bilateral tibial H reflex studies are within normal limits. 4. There is no evidence of active or chronic motor axonal changes affecting any of the tested muscles.  Motor unit configuration and recruitment pattern is within normal limits.  Impression: This is a normal study of the lower extremities.  In particular, there is no evidence of a sensorimotor polyneuropathy or lumbosacral radiculopathy.   ___________________________ 71, DO    Nerve Conduction Studies Anti Sensory Summary Table   Stim Site NR Peak (ms) Norm Peak (ms) P-T Amp (V) Norm P-T Amp  Left Sup Peroneal Anti Sensory (Ant Lat Mall)  32C  12 cm    3.0 <4.6 8.0 >3  Right Sup Peroneal Anti Sensory (Ant Lat Mall)  32C  12 cm    2.5 <4.6 9.8 >3  Left Sural Anti Sensory (Lat Mall)  32C  Calf    3.2 <4.6 19.0 >3  Right Sural Anti Sensory (Lat Mall)  32C  Calf    3.0 <4.6 14.1 >3   Motor Summary Table   Stim Site NR Onset (ms) Norm Onset (ms) O-P Amp (mV) Norm O-P Amp Site1 Site2 Delta-0 (ms) Dist (cm) Vel (m/s) Norm Vel (m/s)  Left Peroneal Motor (Ext Dig Brev)  32C  Ankle    3.4 <6.0 2.9 >2.5 B Fib Ankle 7.5 36.0 48 >40  B Fib    10.9  2.8  Poplt B Fib 1.5 8.0 53 >40  Poplt    12.4  2.6         Right Peroneal  Motor (Ext Dig Brev)  32C  Ankle    3.0 <6.0 4.2 >2.5 B Fib Ankle 7.8 37.0 47 >40  B Fib    10.8  3.9  Poplt B Fib 1.4 8.0 57 >40  Poplt    12.2  3.8         Left Tibial Motor (Abd Hall Brev)  32C  Ankle    3.3 <6.0 9.3 >4 Knee Ankle 7.6 41.0 54 >40  Knee    10.9  7.1         Right Tibial Motor (Abd Hall Brev)  32C  Ankle    4.0 <6.0 12.5 >4 Knee Ankle 9.6 42.0 44 >40  Knee    13.6  10.5          H Reflex Studies   NR H-Lat (ms) Lat Norm (ms) L-R H-Lat (ms)  Left Tibial (Gastroc)  32C     32.52 <35 2.18  Right Tibial (Gastroc)  32C     34.69 <35 2.18   EMG   Side Muscle Ins Act Fibs Psw Fasc Number Recrt Dur Dur. Amp Amp. Poly Poly. Comment  Left AntTibialis Nml Nml Nml Nml Nml Nml Nml Nml Nml Nml Nml Nml N/A  Left Gastroc Nml Nml Nml Nml Nml Nml Nml Nml  Nml Nml Nml Nml N/A  Left RectFemoris Nml Nml Nml Nml Nml Nml Nml Nml Nml Nml Nml Nml N/A  Left GluteusMed Nml Nml Nml Nml Nml Nml Nml Nml Nml Nml Nml Nml N/A  Right AntTibialis Nml Nml Nml Nml Nml Nml Nml Nml Nml Nml Nml Nml N/A  Right Gastroc Nml Nml Nml Nml Nml Nml Nml Nml Nml Nml Nml Nml N/A  Right Flex Dig Long Nml Nml Nml Nml Nml Nml Nml Nml Nml Nml Nml Nml N/A  Right RectFemoris Nml Nml Nml Nml Nml Nml Nml Nml Nml Nml Nml Nml N/A  Right GluteusMed Nml Nml Nml Nml Nml Nml Nml Nml Nml Nml Nml Nml N/A      Waveforms:

## 2019-10-14 NOTE — Progress Notes (Signed)
Virtual Visit Via Video   The purpose of this virtual visit is to provide medical care while limiting exposure to the novel coronavirus.    Consent was obtained for video visit:  Yes.   Answered questions that patient had about telehealth interaction:  Yes.   I discussed the limitations, risks, security and privacy concerns of performing an evaluation and management service by telemedicine. I also discussed with the patient that there may be a patient responsible charge related to this service. The patient expressed understanding and agreed to proceed.  Pt location: Home Physician Location: office Name of referring provider:  [name], MD I connected with Shelah Lewandowsky at patients initiation/request on 10/16/2019 at  9:45 AM EDT by video enabled telemedicine application and verified that I am speaking with the correct person using two identifiers. Pt MRN:  [id number] Pt DOB:  1955/07/14 Video Participants:  Shelah Lewandowsky;    Assessment/Plan:   1.  Tremor  -Component of essential tremor, but patient understands complex relationship that alcohol has on tremor as well.  She states that she is only drinking 1 time per week.  -Continue primidone, 50 mg nightly  2.  Ataxia  -[facility] testing was negative for genetic SCA  -EMG was normal.  -MRI brain has been essentially unremarkable, with the exception of small vessel disease.  -Decided to go ahead and pursue a DaTscan, although I am not convinced that this will yield anything of value.  If that is negative, I told her that there is probably nothing else to do from a neurologic standpoint that I can think of.  We could certainly pursue a second opinion if she would like.  She thinks that knee pain may be playing a role but can't imagine that is only issue.   Subjective:   Michele Swanson was seen today in follow up for ataxia and tremor.  My previous records as well as any outside records available were reviewed prior to todays visit.   Pt is currently on primidone and states that "it has stopped all that shaking." Pt is using walker but fell since last visit - "I didn't get hurt."    Pt states that she feels very weak but is having pain in the knee and wonders if that is contributing - "no one has checked."   Current movement disorder medications: Primidone 50 mg nightly (started last visit     CURRENT MEDICATIONS:  Outpatient Encounter Medications as of 10/16/2019  Medication Sig  . amLODipine (NORVASC) 5 MG tablet Take 5 mg by mouth daily.  . Ascorbic Acid (VITAMIN C) 100 MG tablet Take 100 mg by mouth daily.  . Cholecalciferol (VITAMIN D3) 50 MCG (2000 UT) capsule Take 2,000 Units by mouth daily.  . Coenzyme Q10 (COQ10) 100 MG CAPS Take 1 tablet by mouth daily.   . Eszopiclone (ESZOPICLONE) 3 MG TABS Take 3 mg by mouth at bedtime. Take immediately before bedtime  . folic acid (FOLVITE) 400 MCG tablet Take 400 mcg by mouth daily.   . hydrochlorothiazide (HYDRODIURIL) 12.5 MG tablet Take 12.5 mg by mouth daily.  . irbesartan (AVAPRO) 150 MG tablet Take 150 mg by mouth daily.  Marland Kitchen MITIGARE 0.6 MG CAPS Take by mouth as needed. Take as directed  . primidone (MYSOLINE) 50 MG tablet Take 1 tablet (50 mg total) by mouth at bedtime.  Marland Kitchen trimethoprim-polymyxin b (POLYTRIM) ophthalmic solution as needed.  . Venlafaxine HCl 75 MG TB24 Take 75 mg by mouth in  the morning and at bedtime.   . vitamin B-12 (CYANOCOBALAMIN) 1000 MCG tablet Take 1,000 mcg by mouth daily.  . [DISCONTINUED] amLODipine (NORVASC) 10 MG tablet Take 5 mg by mouth daily.  (Patient not taking: Reported on 10/16/2019)  . [DISCONTINUED] Eszopiclone 3 MG TABS eszopiclone 3 mg tablet  Take 1 tablet every day by oral route. (Patient not taking: Reported on 10/16/2019)  . [DISCONTINUED] Irbesartan (AVAPRO PO) irbesartan (Patient not taking: Reported on 10/16/2019)  . [DISCONTINUED] irbesartan (AVAPRO) 150 MG tablet Take 150 mg by mouth daily. (Patient not taking: Reported on  10/16/2019)  . [DISCONTINUED] olmesartan (BENICAR) 20 MG tablet Take 20 mg by mouth daily. (Patient not taking: Reported on 10/16/2019)   No facility-administered encounter medications on file as of 10/16/2019.     Objective:   PHYSICAL EXAMINATION:    VITALS:   Vitals:   10/16/19 0926  Weight: 137 lb (62.1 kg)  Height: 5\' 5"  (1.651 m)   Patient originally scheduled for in person visit, but changed it the day before.  Exam was limited because no one else was home and could not hold the phone for her.  GEN:  The patient appears stated age and is in NAD.  Neurological examination:  Orientation: The patient is alert and oriented x3. Cranial nerves: There is good facial symmetry.The speech is fluent and clear.  Motor: Strength is at least antigravity x4.  Movement examination: Abnormal movements:  no tremor.  No myoclonus.  No asterixis.   Coordination:  There is no decremation with RAM's. Gait and Station: Unable through video visit, while still allowing me to see her    Total time spent on today's visit was 20 minutes, including both face-to-face time and nonface-to-face time.  Time included that spent on review of records (prior notes available to me/labs/imaging if pertinent), discussing treatment and goals, answering patient's questions and coordinating care.  Cc:  , MD

## 2019-10-16 ENCOUNTER — Telehealth (INDEPENDENT_AMBULATORY_CARE_PROVIDER_SITE_OTHER): Payer: Medicare PPO | Admitting: Neurology

## 2019-10-16 ENCOUNTER — Other Ambulatory Visit: Payer: Self-pay

## 2019-10-16 ENCOUNTER — Encounter: Payer: Self-pay | Admitting: Neurology

## 2019-10-16 VITALS — Ht 65.0 in | Wt 137.0 lb

## 2019-10-16 DIAGNOSIS — R251 Tremor, unspecified: Secondary | ICD-10-CM | POA: Diagnosis not present

## 2019-10-16 DIAGNOSIS — R270 Ataxia, unspecified: Secondary | ICD-10-CM

## 2019-10-29 ENCOUNTER — Telehealth: Payer: Self-pay | Admitting: Neurology

## 2019-10-29 NOTE — Telephone Encounter (Signed)
Patient's spouse, Ron, called wanting an explanation of what a DaT scan is so he can better explain it to the patient.  He also said if there are any consent forms that need signed, he'd like to use "docuesign" if possible. (Not sure if that is an option.)

## 2019-10-29 NOTE — Telephone Encounter (Signed)
I explained to patient exactly what it was at the visit.  Unfortunately I don't think that he was present.  I don't have time to do a separate visit just to go over it again for him but we can send him a DaT brochure that explains it or he can pick one up from our office if he would like.  There is no such thing as a docusign for it.

## 2019-10-30 NOTE — Telephone Encounter (Signed)
Spoke with Michele Swanson and informed him of Dr Don Perking recommendations. He voiced understanding. Explained to Michele Swanson that he must come to the office to sign the form we can not docusign.   He states he wants to proceed with the scan but he needs to convince the patient to do it because she is scare of going to the hospital with Covid numbers being high.   Advised him to contact the office once he is ready to sign the form. He voiced understanding.

## 2019-11-14 DIAGNOSIS — Z23 Encounter for immunization: Secondary | ICD-10-CM | POA: Diagnosis not present

## 2019-11-18 DIAGNOSIS — L821 Other seborrheic keratosis: Secondary | ICD-10-CM | POA: Diagnosis not present

## 2019-11-18 DIAGNOSIS — D225 Melanocytic nevi of trunk: Secondary | ICD-10-CM | POA: Diagnosis not present

## 2019-11-18 DIAGNOSIS — Z85828 Personal history of other malignant neoplasm of skin: Secondary | ICD-10-CM | POA: Diagnosis not present

## 2019-11-18 DIAGNOSIS — L853 Xerosis cutis: Secondary | ICD-10-CM | POA: Diagnosis not present

## 2019-11-18 DIAGNOSIS — L570 Actinic keratosis: Secondary | ICD-10-CM | POA: Diagnosis not present

## 2019-11-22 ENCOUNTER — Other Ambulatory Visit: Payer: Self-pay

## 2019-11-22 DIAGNOSIS — R251 Tremor, unspecified: Secondary | ICD-10-CM

## 2020-01-07 ENCOUNTER — Other Ambulatory Visit: Payer: Self-pay | Admitting: Neurology

## 2020-01-22 DIAGNOSIS — D518 Other vitamin B12 deficiency anemias: Secondary | ICD-10-CM | POA: Diagnosis not present

## 2020-01-22 DIAGNOSIS — F419 Anxiety disorder, unspecified: Secondary | ICD-10-CM | POA: Diagnosis not present

## 2020-01-22 DIAGNOSIS — G252 Other specified forms of tremor: Secondary | ICD-10-CM | POA: Diagnosis not present

## 2020-01-22 DIAGNOSIS — E559 Vitamin D deficiency, unspecified: Secondary | ICD-10-CM | POA: Diagnosis not present

## 2020-01-22 DIAGNOSIS — I10 Essential (primary) hypertension: Secondary | ICD-10-CM | POA: Diagnosis not present

## 2020-01-22 DIAGNOSIS — M109 Gout, unspecified: Secondary | ICD-10-CM | POA: Diagnosis not present

## 2020-01-22 DIAGNOSIS — R7309 Other abnormal glucose: Secondary | ICD-10-CM | POA: Diagnosis not present

## 2020-01-22 DIAGNOSIS — E782 Mixed hyperlipidemia: Secondary | ICD-10-CM | POA: Diagnosis not present

## 2020-01-22 DIAGNOSIS — F17210 Nicotine dependence, cigarettes, uncomplicated: Secondary | ICD-10-CM | POA: Diagnosis not present

## 2020-07-10 ENCOUNTER — Other Ambulatory Visit: Payer: Self-pay | Admitting: Neurology

## 2020-09-03 ENCOUNTER — Other Ambulatory Visit: Payer: Self-pay | Admitting: Family Medicine

## 2020-09-03 ENCOUNTER — Other Ambulatory Visit (HOSPITAL_COMMUNITY)
Admission: RE | Admit: 2020-09-03 | Discharge: 2020-09-03 | Disposition: A | Discharge status: Home / Self Care | Payer: Medicare PPO | Source: Ambulatory Visit | Attending: Family Medicine | Admitting: Family Medicine

## 2020-09-03 DIAGNOSIS — Z1151 Encounter for screening for human papillomavirus (HPV): Secondary | ICD-10-CM | POA: Diagnosis not present

## 2020-09-03 DIAGNOSIS — Z1159 Encounter for screening for other viral diseases: Secondary | ICD-10-CM | POA: Diagnosis not present

## 2020-09-03 DIAGNOSIS — E782 Mixed hyperlipidemia: Secondary | ICD-10-CM | POA: Diagnosis not present

## 2020-09-03 DIAGNOSIS — Z124 Encounter for screening for malignant neoplasm of cervix: Secondary | ICD-10-CM | POA: Insufficient documentation

## 2020-09-03 DIAGNOSIS — R7303 Prediabetes: Secondary | ICD-10-CM | POA: Diagnosis not present

## 2020-09-03 DIAGNOSIS — F331 Major depressive disorder, recurrent, moderate: Secondary | ICD-10-CM | POA: Diagnosis not present

## 2020-09-03 DIAGNOSIS — F17200 Nicotine dependence, unspecified, uncomplicated: Secondary | ICD-10-CM | POA: Diagnosis not present

## 2020-09-03 DIAGNOSIS — Z0000 Encounter for general adult medical examination without abnormal findings: Secondary | ICD-10-CM | POA: Diagnosis not present

## 2020-09-03 DIAGNOSIS — I700 Atherosclerosis of aorta: Secondary | ICD-10-CM | POA: Diagnosis not present

## 2020-09-03 DIAGNOSIS — I10 Essential (primary) hypertension: Secondary | ICD-10-CM | POA: Diagnosis not present

## 2020-09-08 LAB — CYTOLOGY - PAP
Comment: NEGATIVE
Diagnosis: NEGATIVE
High risk HPV: NEGATIVE

## 2020-10-21 ENCOUNTER — Encounter (HOSPITAL_BASED_OUTPATIENT_CLINIC_OR_DEPARTMENT_OTHER): Payer: Self-pay | Admitting: *Deleted

## 2020-10-21 ENCOUNTER — Other Ambulatory Visit: Payer: Self-pay

## 2020-10-21 ENCOUNTER — Emergency Department (HOSPITAL_BASED_OUTPATIENT_CLINIC_OR_DEPARTMENT_OTHER)
Admission: EM | Admit: 2020-10-21 | Discharge: 2020-10-21 | Disposition: A | Discharge status: Home / Self Care | Payer: Medicare PPO | Attending: Emergency Medicine | Admitting: Emergency Medicine

## 2020-10-21 ENCOUNTER — Emergency Department (HOSPITAL_BASED_OUTPATIENT_CLINIC_OR_DEPARTMENT_OTHER): Payer: Medicare PPO | Admitting: Radiology

## 2020-10-21 DIAGNOSIS — J40 Bronchitis, not specified as acute or chronic: Secondary | ICD-10-CM | POA: Diagnosis not present

## 2020-10-21 DIAGNOSIS — F17210 Nicotine dependence, cigarettes, uncomplicated: Secondary | ICD-10-CM | POA: Insufficient documentation

## 2020-10-21 DIAGNOSIS — Z79899 Other long term (current) drug therapy: Secondary | ICD-10-CM | POA: Diagnosis not present

## 2020-10-21 DIAGNOSIS — R519 Headache, unspecified: Secondary | ICD-10-CM | POA: Diagnosis not present

## 2020-10-21 DIAGNOSIS — J069 Acute upper respiratory infection, unspecified: Secondary | ICD-10-CM | POA: Insufficient documentation

## 2020-10-21 DIAGNOSIS — I10 Essential (primary) hypertension: Secondary | ICD-10-CM | POA: Insufficient documentation

## 2020-10-21 DIAGNOSIS — Z20822 Contact with and (suspected) exposure to covid-19: Secondary | ICD-10-CM | POA: Insufficient documentation

## 2020-10-21 DIAGNOSIS — J029 Acute pharyngitis, unspecified: Secondary | ICD-10-CM | POA: Diagnosis not present

## 2020-10-21 DIAGNOSIS — R059 Cough, unspecified: Secondary | ICD-10-CM | POA: Diagnosis not present

## 2020-10-21 LAB — RESP PANEL BY RT-PCR (FLU A&B, COVID) ARPGX2
Influenza A by PCR: NEGATIVE
Influenza B by PCR: NEGATIVE
SARS Coronavirus 2 by RT PCR: NEGATIVE

## 2020-10-21 NOTE — Discharge Instructions (Addendum)
COVID testing and influenza testing is negative.  Chest x-ray negative for pneumonia.  Symptoms seem to be consistent with upper respiratory infection or bronchitis.  Recommend over-the-counter medications like Mucinex DM make an appointment to follow-up with your regular doctor.  Return for any new or worse symptoms.

## 2020-10-21 NOTE — ED Provider Notes (Signed)
[facility] EMERGENCY DEPT Provider Note   CSN: [id number] Arrival date & time: 10/21/20  1046     History Chief Complaint  Patient presents with   Covid Test   Cough    Michele Swanson is a 65 y.o. female.  Patient on Friday developed sore throat headache and productive cough.  Patient went today to [facility] for testing but was referred here.  Patient symptoms as stated started on Friday.  Patient is a current smoker.      Past Medical History:  Diagnosis Date   Actinic keratoses    Anxiety    Depression    Fall    Hypercholesterolemia    Hypertension    Insomnia    Low back pain    Tremor     Patient Active Problem List   Diagnosis Date Noted   Tremor 10/23/2015    Past Surgical History:  Procedure Laterality Date   SHOULDER SURGERY Right 10/2008   pinning     OB History   No obstetric history on file.     Family History  Problem Relation Age of Onset   Dementia Mother    Heart disease Father    Atrial fibrillation Brother    Heart disease Paternal Grandmother     Social History   Tobacco Use   Smoking status: Every Day    Packs/day: 0.50    Types: Cigarettes   Smokeless tobacco: Never   Tobacco comments:    10/23/15 trying to cut back  Vaping Use   Vaping Use: Never used  Substance Use Topics   Alcohol use: Yes    Alcohol/week: 2.0 standard drinks    Types: 2 Glasses of wine per week   Drug use: No    Home Medications Prior to Admission medications   Medication Sig Start Date End Date Taking? Authorizing Provider  amLODipine (NORVASC) 5 MG tablet Take 5 mg by mouth daily. 12/31/18   [provider]  Ascorbic Acid (VITAMIN C) 100 MG tablet Take 100 mg by mouth daily.    [provider]  Cholecalciferol (VITAMIN D3) 50 MCG (2000 UT) capsule Take 2,000 Units by mouth daily.    [provider]  Coenzyme Q10 (COQ10) 100 MG CAPS Take 1 tablet by mouth daily.     [provider]  Eszopiclone  (ESZOPICLONE) 3 MG TABS Take 3 mg by mouth at bedtime. Take immediately before bedtime    [provider]  folic acid (FOLVITE) 400 MCG tablet Take 400 mcg by mouth daily.     [provider]  hydrochlorothiazide (HYDRODIURIL) 12.5 MG tablet Take 12.5 mg by mouth daily. 12/31/18   [provider]  irbesartan (AVAPRO) 150 MG tablet Take 150 mg by mouth daily.    [provider]  MITIGARE 0.6 MG CAPS Take by mouth as needed. Take as directed 09/25/19   [provider]  primidone (MYSOLINE) 50 MG tablet TAKE ONE TABLET AT BEDTIME. 01/07/20   Tat, Octaviano Batty, DO  trimethoprim-polymyxin b (POLYTRIM) ophthalmic solution as needed. 08/20/19   [provider]  Venlafaxine HCl 75 MG TB24 Take 75 mg by mouth in the morning and at bedtime.     [provider]  vitamin B-12 (CYANOCOBALAMIN) 1000 MCG tablet Take 1,000 mcg by mouth daily.    [provider]    Allergies    Ambien [zolpidem tartrate], Hydrocodone-acetaminophen, and Voltaren [diclofenac sodium]  Review of Systems   Review of Systems  Constitutional:  Negative  for chills and fever.  HENT:  Positive for congestion and sore throat. Negative for ear pain.   Eyes:  Negative for pain and visual disturbance.  Respiratory:  Positive for cough. Negative for shortness of breath.   Cardiovascular:  Negative for chest pain and palpitations.  Gastrointestinal:  Negative for abdominal pain and vomiting.  Genitourinary:  Negative for dysuria and hematuria.  Musculoskeletal:  Negative for arthralgias and back pain.  Skin:  Negative for color change and rash.  Neurological:  Positive for headaches. Negative for seizures and syncope.  All other systems reviewed and are negative.  Physical Exam Updated Vital Signs BP 128/74 (BP Location: Right Arm)   Pulse 80   Temp 98.4 F (36.9 C) (Oral)   Resp 16   Ht 1.651 m (5\' 5" )   Wt 60.3 kg   SpO2 93%   BMI 22.13 kg/m   Physical  Exam Vitals and nursing note reviewed.  Constitutional:      General: She is not in acute distress.    Appearance: Normal appearance. She is well-developed.  HENT:     Head: Normocephalic and atraumatic.     Mouth/Throat:     Pharynx: Oropharynx is clear. No oropharyngeal exudate or posterior oropharyngeal erythema.  Eyes:     Extraocular Movements: Extraocular movements intact.     Conjunctiva/sclera: Conjunctivae normal.     Pupils: Pupils are equal, round, and reactive to light.  Cardiovascular:     Rate and Rhythm: Normal rate and regular rhythm.     Heart sounds: No murmur heard. Pulmonary:     Effort: Pulmonary effort is normal. No respiratory distress.     Breath sounds: Normal breath sounds. No wheezing, rhonchi or rales.  Abdominal:     Palpations: Abdomen is soft.     Tenderness: There is no abdominal tenderness.  Musculoskeletal:        General: Normal range of motion.     Cervical back: Normal range of motion and neck supple.  Skin:    General: Skin is warm and dry.     Capillary Refill: Capillary refill takes less than 2 seconds.  Neurological:     General: No focal deficit present.     Mental Status: She is alert and oriented to person, place, and time.     Cranial Nerves: No cranial nerve deficit.     Sensory: No sensory deficit.     Motor: No weakness.    ED Results / Procedures / Treatments   Labs (all labs ordered are listed, but only abnormal results are displayed) Labs Reviewed  RESP PANEL BY RT-PCR (FLU A&B, COVID) [id number]    EKG None  Radiology DG Chest 2 View  Result Date: 10/21/2020 CLINICAL DATA:  Cough.  Sore throat and headache. EXAM: CHEST - 2 VIEW COMPARISON:  10/29/2008 FINDINGS: The heart size and mediastinal contours are within normal limits. Both lungs are clear. No acute osseous findings. Remote healed right posterolateral rib fractures. IMPRESSION: No active cardiopulmonary disease. Electronically Signed   By: 10/31/2008 M.D.   On:  10/21/2020 12:07    Procedures Procedures   Medications Ordered in ED Medications - No data to display  ED Course  I have reviewed the triage vital signs and the nursing notes.  Pertinent labs & imaging results that were available during my care of the patient were reviewed by me and considered in my medical decision making (see chart for details).    MDM Rules/Calculators/A&P  Chest x-ray without evidence of pneumonia.  Lungs clear bilaterally.  COVID testing negative influenza testing negative.  Suspect upper respiratory infection with likely bronchitis.  Symptomatic treatment follow-up with primary care doctor.  Patient's oxygen is 97% on room air.  No acute distress.  No wheezing. Throat not consistent with strep pharyngitis  Final Clinical Impression(s) / ED Diagnoses Final diagnoses:  Upper respiratory tract infection, unspecified type  Bronchitis    Rx / DC Orders ED Discharge Orders     None        Vanetta Mulders, MD 10/21/20 1422

## 2020-10-21 NOTE — ED Triage Notes (Signed)
Friday developed sore throat, headache with productive cough, sore throat and headache better. Went today for [employer] at [place], was sent here. Requesting Covid  test although this is day 5 post symptoms.

## 2020-12-15 DIAGNOSIS — Z20822 Contact with and (suspected) exposure to covid-19: Secondary | ICD-10-CM | POA: Diagnosis not present

## 2021-10-05 DIAGNOSIS — E875 Hyperkalemia: Secondary | ICD-10-CM | POA: Diagnosis not present

## 2021-11-03 DIAGNOSIS — Z23 Encounter for immunization: Secondary | ICD-10-CM | POA: Diagnosis not present

## 2021-11-03 DIAGNOSIS — I700 Atherosclerosis of aorta: Secondary | ICD-10-CM | POA: Diagnosis not present

## 2021-11-03 DIAGNOSIS — E559 Vitamin D deficiency, unspecified: Secondary | ICD-10-CM | POA: Diagnosis not present

## 2021-11-03 DIAGNOSIS — R7303 Prediabetes: Secondary | ICD-10-CM | POA: Diagnosis not present

## 2021-11-03 DIAGNOSIS — E7800 Pure hypercholesterolemia, unspecified: Secondary | ICD-10-CM | POA: Diagnosis not present

## 2021-11-03 DIAGNOSIS — F17210 Nicotine dependence, cigarettes, uncomplicated: Secondary | ICD-10-CM | POA: Diagnosis not present

## 2021-11-03 DIAGNOSIS — G252 Other specified forms of tremor: Secondary | ICD-10-CM | POA: Diagnosis not present

## 2021-11-03 DIAGNOSIS — I10 Essential (primary) hypertension: Secondary | ICD-10-CM | POA: Diagnosis not present

## 2021-11-03 DIAGNOSIS — F3341 Major depressive disorder, recurrent, in partial remission: Secondary | ICD-10-CM | POA: Diagnosis not present

## 2021-11-22 DIAGNOSIS — F331 Major depressive disorder, recurrent, moderate: Secondary | ICD-10-CM | POA: Diagnosis not present

## 2021-11-22 DIAGNOSIS — I700 Atherosclerosis of aorta: Secondary | ICD-10-CM | POA: Diagnosis not present

## 2021-11-22 DIAGNOSIS — F17210 Nicotine dependence, cigarettes, uncomplicated: Secondary | ICD-10-CM | POA: Diagnosis not present

## 2021-11-22 DIAGNOSIS — G4700 Insomnia, unspecified: Secondary | ICD-10-CM | POA: Diagnosis not present

## 2021-11-22 DIAGNOSIS — I10 Essential (primary) hypertension: Secondary | ICD-10-CM | POA: Diagnosis not present

## 2021-11-22 DIAGNOSIS — E7800 Pure hypercholesterolemia, unspecified: Secondary | ICD-10-CM | POA: Diagnosis not present

## 2021-11-22 DIAGNOSIS — Z0000 Encounter for general adult medical examination without abnormal findings: Secondary | ICD-10-CM | POA: Diagnosis not present

## 2021-11-22 DIAGNOSIS — Z1331 Encounter for screening for depression: Secondary | ICD-10-CM | POA: Diagnosis not present

## 2021-11-22 DIAGNOSIS — F419 Anxiety disorder, unspecified: Secondary | ICD-10-CM | POA: Diagnosis not present

## 2021-11-22 DIAGNOSIS — R7303 Prediabetes: Secondary | ICD-10-CM | POA: Diagnosis not present

## 2021-12-07 DIAGNOSIS — E875 Hyperkalemia: Secondary | ICD-10-CM | POA: Diagnosis not present

## 2022-02-01 IMAGING — MR MR HEAD W/O CM
10 series · 48 of 48 positions shown · non-contrast
Comparison: Head CT 10/18/2008

CLINICAL DATA: Leg weakness and gait difficulties with frequent
falls.

EXAM:
MRI HEAD WITHOUT CONTRAST
TECHNIQUE: Multiplanar, multiecho pulse sequences of the brain and surrounding
structures were obtained without intravenous contrast.

[Series 2: t1_se_sag · sagittal · 5.0mm · 0.45mm/px · 2 of 21 slices shown]
[im 1/21]
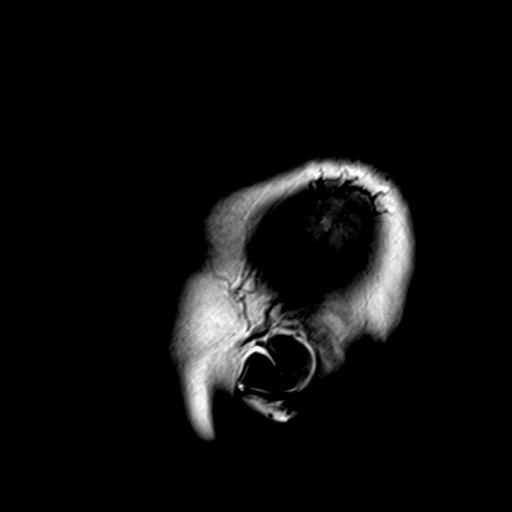
[im 21/21]
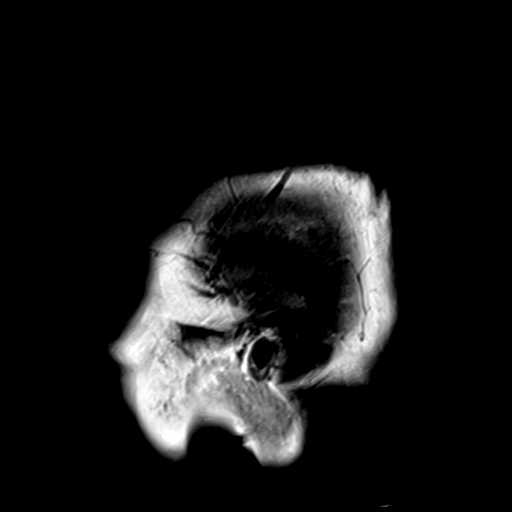

[Series 3: t2_tse_tra_512 · axial · 5.0mm · 0.60mm/px · z∈[-58,+80]mm · 2 of 24 slices shown]
[im 1/24]
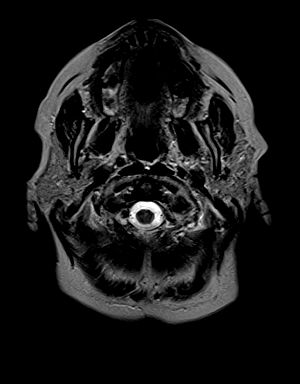
[im 24/24]
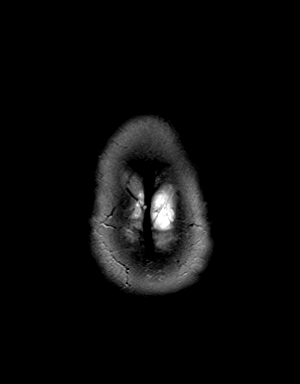

[Series 4: ep2d_diff_ · axial · 3.0mm · 1.80mm/px · z∈[-62,+78]mm · 9 of 94 slices shown]
[im 1/94]
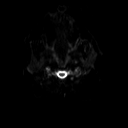
[im 12/94]
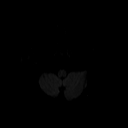
[im 24/94]
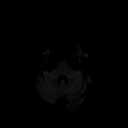
[im 35/94]
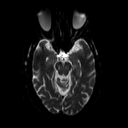
[im 47/94]
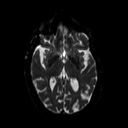
[im 59/94]
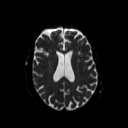
[im 70/94]
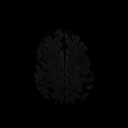
[im 82/94]
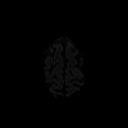
[im 94/94]
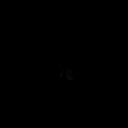

[Series 5: ep2d_diff__adc · axial · 3.0mm · 1.80mm/px · z∈[-62,+78]mm · 5 of 48 slices shown]
[im 1/48]
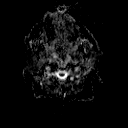
[im 12/48]
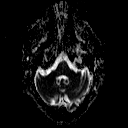
[im 24/48]
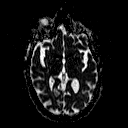
[im 36/48]
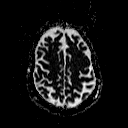
[im 48/48]
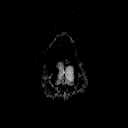

[Series 6: ep2d_diff_cor · coronal · 5.0mm · 1.77mm/px · 5 of 53 slices shown]
[im 1/53]
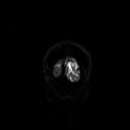
[im 14/53]
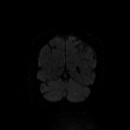
[im 27/53]
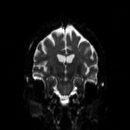
[im 40/53]
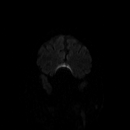
[im 53/53]
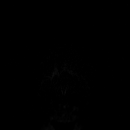

[Series 7: ep2d_diff_cor_adc · coronal · 5.0mm · 1.77mm/px · 3 of 27 slices shown]
[im 1/27]
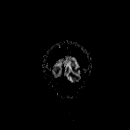
[im 14/27]
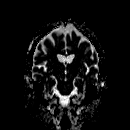
[im 27/27]
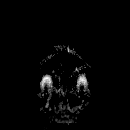

[Series 9: swi_images · axial · 4.0mm · 0.90mm/px · z∈[-59,+81]mm · 3 of 36 slices shown]
[im 1/36]
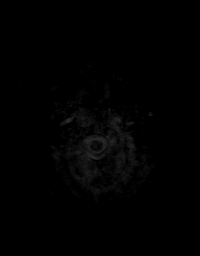
[im 18/36]
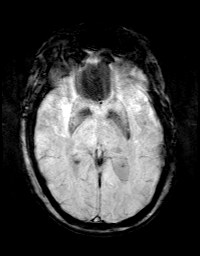
[im 36/36]
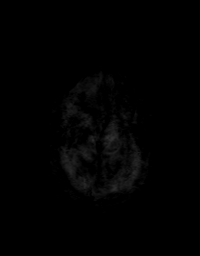

[Series 10: FLAIR · axial · 3.0mm · 0.43mm/px · z∈[-66,+89]mm · 3 of 27 slices shown]
[im 1/27]
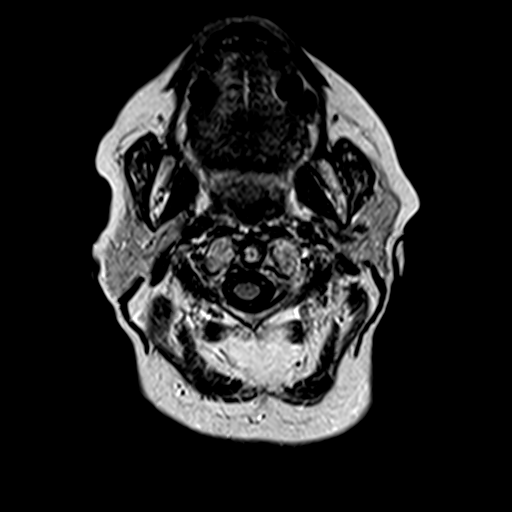
[im 14/27]
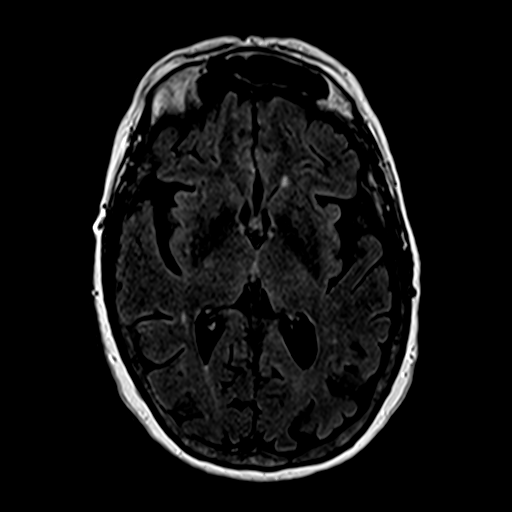
[im 27/27]
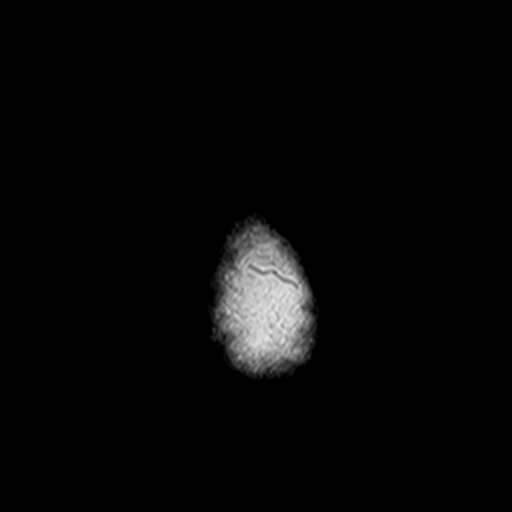

[Series 11: t1_mpr_tra · axial · 1.0mm · 0.72mm/px · z∈[-60,+83]mm · 14 of 144 slices shown]
[im 1/144]
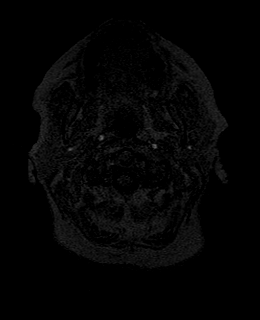
[im 12/144]
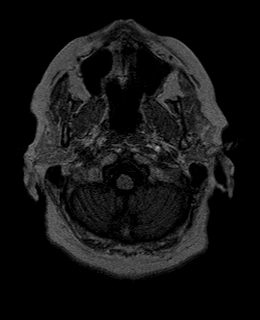
[im 23/144]
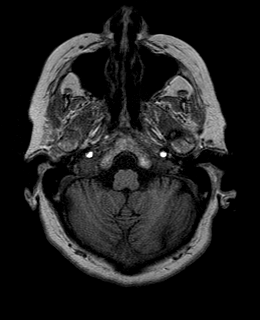
[im 34/144]
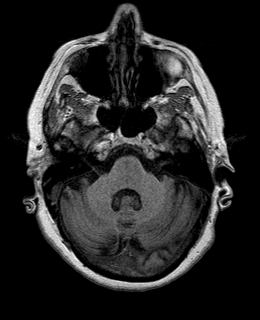
[im 45/144]
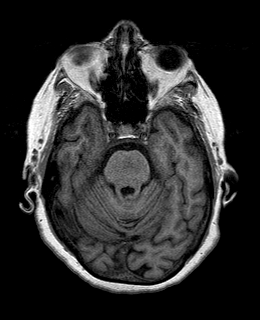
[im 56/144]
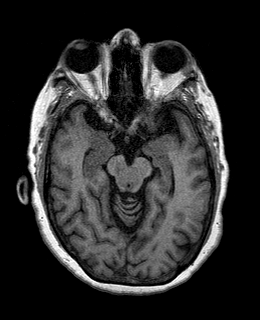
[im 67/144]
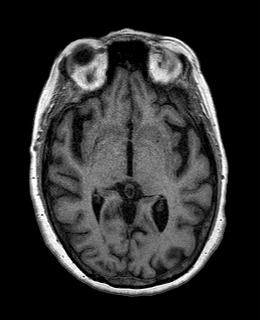
[im 78/144]
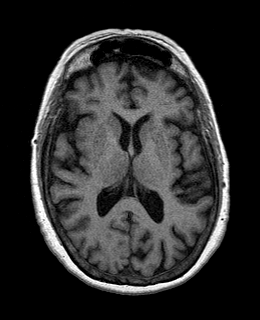
[im 89/144]
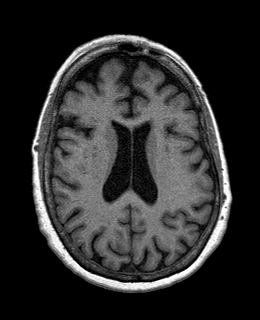
[im 100/144]
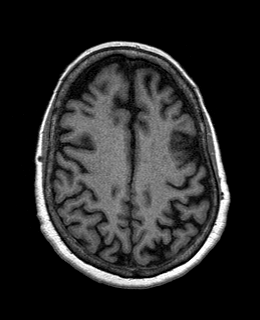
[im 111/144]
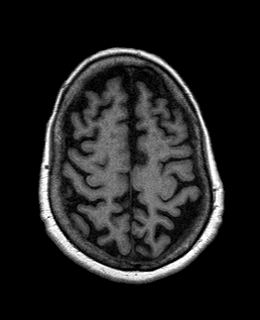
[im 122/144]
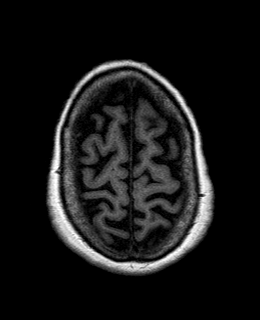
[im 133/144]
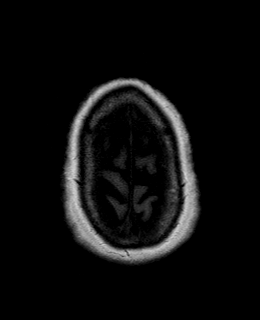
[im 144/144]
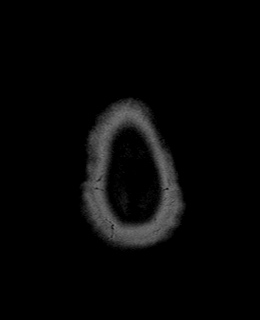

[Series 12: T2 · coronal · 5.0mm · 0.45mm/px · 2 of 26 slices shown]
[im 1/26]
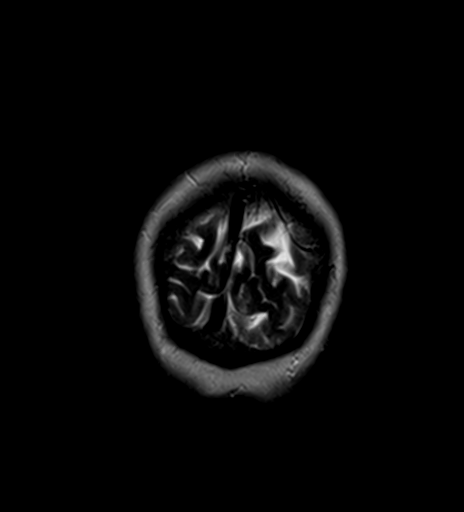
[im 26/26]
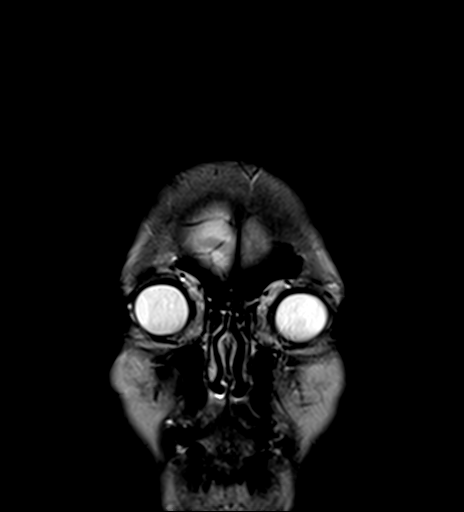

[48 of 48 positions shown; findings below may reference images not displayed]

FINDINGS: Brain: There is no evidence of acute infarct, intracranial
hemorrhage, mass, midline shift, or extra-axial fluid collection.
There is mild cerebral atrophy. Scattered small foci of T2
hyperintensity in the cerebral white matter and patchy T2
hyperintensities in the pons are nonspecific but compatible with
mild chronic small vessel ischemic disease.

Vascular: Major intracranial vascular flow voids are preserved.

Skull and upper cervical spine: Unremarkable bone marrow signal.

Sinuses/Orbits: Unremarkable orbits. Paranasal sinuses and mastoid
air cells are clear.

Other: None.
IMPRESSION: 1. No acute intracranial abnormality.
2. Mild chronic small vessel ischemic disease most notable in the
pons.

## 2022-05-26 ENCOUNTER — Other Ambulatory Visit (HOSPITAL_BASED_OUTPATIENT_CLINIC_OR_DEPARTMENT_OTHER): Payer: Self-pay | Admitting: Family Medicine

## 2022-05-26 DIAGNOSIS — I10 Essential (primary) hypertension: Secondary | ICD-10-CM | POA: Diagnosis not present

## 2022-05-26 DIAGNOSIS — Z9989 Dependence on other enabling machines and devices: Secondary | ICD-10-CM | POA: Diagnosis not present

## 2022-05-26 DIAGNOSIS — E782 Mixed hyperlipidemia: Secondary | ICD-10-CM | POA: Diagnosis not present

## 2022-05-26 DIAGNOSIS — I700 Atherosclerosis of aorta: Secondary | ICD-10-CM | POA: Diagnosis not present

## 2022-05-26 DIAGNOSIS — F331 Major depressive disorder, recurrent, moderate: Secondary | ICD-10-CM | POA: Diagnosis not present

## 2022-05-26 DIAGNOSIS — R7309 Other abnormal glucose: Secondary | ICD-10-CM | POA: Diagnosis not present

## 2022-05-26 DIAGNOSIS — G4700 Insomnia, unspecified: Secondary | ICD-10-CM | POA: Diagnosis not present

## 2022-05-26 DIAGNOSIS — Z1231 Encounter for screening mammogram for malignant neoplasm of breast: Secondary | ICD-10-CM

## 2022-05-26 DIAGNOSIS — M2550 Pain in unspecified joint: Secondary | ICD-10-CM | POA: Diagnosis not present

## 2022-06-03 ENCOUNTER — Ambulatory Visit (HOSPITAL_BASED_OUTPATIENT_CLINIC_OR_DEPARTMENT_OTHER)
Admission: RE | Admit: 2022-06-03 | Discharge: 2022-06-03 | Disposition: A | Discharge status: Home / Self Care | Payer: Medicare PPO | Source: Ambulatory Visit | Attending: Family Medicine | Admitting: Family Medicine

## 2022-06-03 DIAGNOSIS — Z1231 Encounter for screening mammogram for malignant neoplasm of breast: Secondary | ICD-10-CM | POA: Diagnosis not present

## 2022-06-08 ENCOUNTER — Other Ambulatory Visit: Payer: Self-pay | Admitting: Family Medicine

## 2022-06-08 DIAGNOSIS — R928 Other abnormal and inconclusive findings on diagnostic imaging of breast: Secondary | ICD-10-CM

## 2022-06-20 ENCOUNTER — Ambulatory Visit
Admission: RE | Admit: 2022-06-20 | Discharge: 2022-06-20 | Disposition: A | Discharge status: Home / Self Care | Payer: Medicare PPO | Source: Ambulatory Visit | Attending: Family Medicine | Admitting: Family Medicine

## 2022-06-20 ENCOUNTER — Other Ambulatory Visit: Payer: Self-pay | Admitting: Family Medicine

## 2022-06-20 DIAGNOSIS — N6320 Unspecified lump in the left breast, unspecified quadrant: Secondary | ICD-10-CM

## 2022-06-20 DIAGNOSIS — N63 Unspecified lump in unspecified breast: Secondary | ICD-10-CM | POA: Diagnosis not present

## 2022-06-20 DIAGNOSIS — R921 Mammographic calcification found on diagnostic imaging of breast: Secondary | ICD-10-CM | POA: Diagnosis not present

## 2022-06-20 DIAGNOSIS — R928 Other abnormal and inconclusive findings on diagnostic imaging of breast: Secondary | ICD-10-CM

## 2022-06-20 DIAGNOSIS — R922 Inconclusive mammogram: Secondary | ICD-10-CM | POA: Diagnosis not present

## 2022-06-24 ENCOUNTER — Ambulatory Visit
Admission: RE | Admit: 2022-06-24 | Discharge: 2022-06-24 | Disposition: A | Discharge status: Home / Self Care | Payer: Medicare PPO | Source: Ambulatory Visit | Attending: Family Medicine | Admitting: Family Medicine

## 2022-06-24 DIAGNOSIS — N6321 Unspecified lump in the left breast, upper outer quadrant: Secondary | ICD-10-CM | POA: Diagnosis not present

## 2022-06-24 DIAGNOSIS — N6320 Unspecified lump in the left breast, unspecified quadrant: Secondary | ICD-10-CM

## 2022-06-24 DIAGNOSIS — R928 Other abnormal and inconclusive findings on diagnostic imaging of breast: Secondary | ICD-10-CM

## 2022-06-24 DIAGNOSIS — C50412 Malignant neoplasm of upper-outer quadrant of left female breast: Secondary | ICD-10-CM | POA: Diagnosis not present

## 2022-06-24 DIAGNOSIS — D0512 Intraductal carcinoma in situ of left breast: Secondary | ICD-10-CM | POA: Diagnosis not present

## 2022-06-24 DIAGNOSIS — N6022 Fibroadenosis of left breast: Secondary | ICD-10-CM | POA: Diagnosis not present

## 2022-06-24 HISTORY — PX: BREAST BIOPSY: SHX20

## 2022-06-28 ENCOUNTER — Telehealth: Payer: Self-pay | Admitting: Hematology and Oncology

## 2022-06-28 NOTE — Telephone Encounter (Signed)
Spoke to patient to confirm upcoming afternoon [facility] clinic appointment on 5/22, paperwork will be sent via mail.   Gave location and time, also informed patient that the surgeon's office would be calling as well to get information from them similar to the packet that they will be receiving so make sure to do both.  Reminded patient that all providers will be coming to the clinic to see them HERE and if they had any questions to not hesitate to reach back out to myself or their navigators.

## 2022-07-04 ENCOUNTER — Encounter: Payer: Self-pay | Admitting: *Deleted

## 2022-07-04 DIAGNOSIS — Z170 Estrogen receptor positive status [ER+]: Secondary | ICD-10-CM | POA: Insufficient documentation

## 2022-07-05 NOTE — Progress Notes (Signed)
Radiation Oncology         [phone] ________________________________  Multidisciplinary Breast Oncology Clinic [facility]) Initial Outpatient Consultation  Name: Michele Swanson MRN: [id number]  Date: 07/06/2022  DOB: Jul 14, 1955  VH:QIONG, Sonny Masters, MD  Harriette Bouillon, MD   REFERRING PHYSICIAN: Harriette Bouillon, MD  DIAGNOSIS: There were no encounter diagnoses.  Stage *** Left Breast UOQ, Invasive and in situ ductal carcinoma, ER+ / PR+ / Her2-, Grade 1  No diagnosis found.  HISTORY OF PRESENT ILLNESS::Michele Swanson is a 67 y.o. female who is presenting to the office today for evaluation of her newly diagnosed breast cancer. She is accompanied by ***. She is doing well overall.   She had routine screening mammography on 06/03/22 showing a possible abnormality in the left breast. She underwent left breast diagnostic mammography with tomography and left breast ultrasonography at [facility] on 06/20/22 showing: a 5 mm mass with associated architectural distortion in the 2 o'clock left breast 7 cmfn suspicious for malignancy, as well as an indeterminate mass also located in the 2 o'clock left breast, 7 cmfn, measuring 9 mm. No abnormal left axillary lymph nodes were appreciated.   Biopsy of the 5 mm 2 o'clock left breast mass on 06/24/22 showed: grade 1 invasive ductal carcinoma measuring 7.5 mm in the greatest linear extent of the sample, with low grade DCIS. Prognostic indicators significant for: estrogen receptor, 100% positive and progesterone receptor, 100% positive, both with strong staining intensity. Proliferation marker Ki67 at 5%. HER2 negative.  Biopsy of the indeterminate 2 o'clock left breast mass showed benign findings.   Menarche: *** years old Age at first live birth: *** years old GP: *** LMP: *** Contraceptive: *** HRT: ***   The patient was referred today for presentation in the multidisciplinary conference.  Radiology studies and pathology slides were presented  there for review and discussion of treatment options.  A consensus was discussed regarding potential next steps.  PREVIOUS RADIATION THERAPY: {EXAM; YES/NO:19492::"No"}  PAST MEDICAL HISTORY:  Past Medical History:  Diagnosis Date   Actinic keratoses    Anxiety    Depression    Fall    Hypercholesterolemia    Hypertension    Insomnia    Low back pain    Tremor     PAST SURGICAL HISTORY: Past Surgical History:  Procedure Laterality Date   BREAST BIOPSY Left 06/24/2022   [place] LT BREAST BX W LOC DEV 1ST LESION IMG BX SPEC US GUIDE 06/24/2022 [facility]   BREAST BIOPSY Left 06/24/2022   [place] LT BREAST BX W LOC DEV EA ADD LESION IMG BX SPEC US GUIDE 06/24/2022 [facility]   REDUCTION MAMMAPLASTY     SHOULDER SURGERY Right 10/2008   pinning    FAMILY HISTORY:  Family History  Problem Relation Age of Onset   Dementia Mother    Heart disease Father    Atrial fibrillation Brother    Heart disease Paternal Grandmother     SOCIAL HISTORY:  Social History   Socioeconomic History   Marital status: Married    Spouse name: Not on file   Number of children: 0   Years of education: 12   Highest education level: Not on file  Occupational History    Comment: retired, 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  Tobacco Use   Smoking status: Every Day    Packs/day: .5    Types: Cigarettes   Smokeless tobacco: Never   Tobacco comments:    10/23/15 trying to cut back  Vaping  Use   Vaping Use: Never used  Substance and Sexual Activity   Alcohol use: Yes    Alcohol/week: 2.0 standard drinks of alcohol    Types: 2 Glasses of wine per week   Drug use: No   Sexual activity: Not on file  Other Topics Concern   Not on file  Social History Narrative   Caffeine - Coke, 1 daily   Right handed    Two story home   Social Determinants of Health   Financial Resource Strain: Not on file  Food Insecurity: Not on file  Transportation Needs: Not on file  Physical Activity: Not on file  Stress:  Not on file  Social Connections: Not on file    ALLERGIES:  Allergies  Allergen Reactions   Ambien [Zolpidem Tartrate] Other (See Comments)    Syncope episode   Hydrocodone-Acetaminophen     Itching all over   Voltaren [Diclofenac Sodium] Nausea Only    MEDICATIONS:  Current Outpatient Medications  Medication Sig Dispense Refill   amLODipine (NORVASC) 5 MG tablet Take 5 mg by mouth daily.     Ascorbic Acid (VITAMIN C) 100 MG tablet Take 100 mg by mouth daily.     Cholecalciferol (VITAMIN D3) 50 MCG (2000 UT) capsule Take 2,000 Units by mouth daily.     Coenzyme Q10 (COQ10) 100 MG CAPS Take 1 tablet by mouth daily.      Eszopiclone (ESZOPICLONE) 3 MG TABS Take 3 mg by mouth at bedtime. Take immediately before bedtime     folic acid (FOLVITE) 400 MCG tablet Take 400 mcg by mouth daily.      hydrochlorothiazide (HYDRODIURIL) 12.5 MG tablet Take 12.5 mg by mouth daily.     irbesartan (AVAPRO) 150 MG tablet Take 150 mg by mouth daily.     MITIGARE 0.6 MG CAPS Take by mouth as needed. Take as directed     primidone (MYSOLINE) 50 MG tablet TAKE ONE TABLET AT BEDTIME. 90 tablet 1   trimethoprim-polymyxin b (POLYTRIM) ophthalmic solution as needed.     Venlafaxine HCl 75 MG TB24 Take 75 mg by mouth in the morning and at bedtime.      vitamin B-12 (CYANOCOBALAMIN) 1000 MCG tablet Take 1,000 mcg by mouth daily.     No current facility-administered medications for this encounter.    REVIEW OF SYSTEMS: A 10+ POINT REVIEW OF SYSTEMS WAS OBTAINED including neurology, dermatology, psychiatry, cardiac, respiratory, lymph, extremities, GI, GU, musculoskeletal, constitutional, reproductive, HEENT. On the provided form, she reports ***. She denies *** and any other symptoms.    PHYSICAL EXAM:  vitals were not taken for this visit.  {may need to copy over vitals} Lungs are clear to auscultation bilaterally. Heart has regular rate and rhythm. No palpable cervical, supraclavicular, or axillary  adenopathy. Abdomen soft, non-tender, normal bowel sounds. Breast: *** breast with no palpable mass, nipple discharge, or bleeding. *** breast with ***.   KPS = ***  100 - Normal; no complaints; no evidence of disease. 90   - Able to carry on normal activity; minor signs or symptoms of disease. 80   - Normal activity with effort; some signs or symptoms of disease. 12   - Cares for self; unable to carry on normal activity or to do active work. 60   - Requires occasional assistance, but is able to care for most of his personal needs. 50   - Requires considerable assistance and frequent medical care. 40   - Disabled; requires special care and  assistance. 30   - Severely disabled; hospital admission is indicated although death not imminent. 20   - Very sick; hospital admission necessary; active supportive treatment necessary. 10   - Moribund; fatal processes progressing rapidly. 0     - Dead  Karnofsky DA, Abelmann WH, Craver LS and Burchenal [facility] [phone]) The use of the nitrogen mustards in the palliative treatment of carcinoma: with particular reference to bronchogenic carcinoma Cancer 1 [id number]  LABORATORY DATA:  Lab Results  Component Value Date   WBC 9.4 01/25/2019   HGB 13.2 01/25/2019   HCT 38.7 01/25/2019   MCV 104 (H) 01/25/2019   PLT 324 01/25/2019   Lab Results  Component Value Date   NA 142 01/25/2019   K 4.5 01/25/2019   CL 103 01/25/2019   CO2 23 01/25/2019   Lab Results  Component Value Date   ALT 10 01/25/2019   AST 12 01/25/2019   ALKPHOS 118 (H) 01/25/2019   BILITOT 0.2 01/25/2019    PULMONARY FUNCTION TEST:   Review Flowsheet        No data to display          RADIOGRAPHY: [place] LT BREAST BX W LOC DEV 1ST LESION IMG BX SPEC US GUIDE  Addendum Date: 06/27/2022   ADDENDUM REPORT: 06/27/2022 15:38 ADDENDUM: PATHOLOGY revealed: Breast, LEFT, needle core biopsy, upper outer, 2 o'clock, 7 cm fn (coil clip). INVASIVE WELL-DIFFERENTIATED DUCTAL ADENOCARCINOMA,  GRADE 1 (1+1+1). LOW-GRADE DUCTAL CARCINOMA IN SITU, CRIBRIFORM TYPE WITHOUT NECROSIS. TUBULE FORMATION: SCORE 1. NUCLEAR PLEOMORPHISM: SCORE 1. MITOTIC COUNT: SCORE 1. TOTAL SCORE: 3. OVERALL GRADE GRADE 1 (3/9). NEGATIVE FOR ANGIOLYMPHATIC INVASION. NEGATIVE FOR MICROCALCIFICATIONS. TUMOR MEASURES 7.5 MM IN GREATEST LINEAR EXTENT. Pathology results are CONCORDANT with imaging findings, per Dr. Laveda Abbe. PATHOLOGY revealed: Breast, LEFT, needle core biopsy, upper outer, 2 o'clock, 7 cm fn (venus clip). BENIGN BREAST SHOWING FIBROMATOID CHANGE WITH ADENOSIS. NEGATIVE FOR MICROCALCIFICATIONS. NEGATIVE FOR ATYPIA AND CARCINOMA. Pathology results are CONCORDANT with imaging findings, per Dr. Laveda Abbe. Pathology results and recommendations below were discussed with patient by telephone on 06/27/22. Patient reported biopsy site within normal limits with slight tenderness at the site. Post biopsy care instructions were reviewed, questions were answered and my direct phone number was provided to patient. Patient was instructed to call [facility] if any concerns or questions arise related to the biopsy. The patient was referred to the [facility] at [facility] with appointment on 07/06/2022. Pathology results reported by Lynett Grimes, RN on 06/27/2022. Electronically Signed   By: Harmon Pier M.D.   On: 06/27/2022 15:38   Result Date: 06/27/2022 CLINICAL DATA:  67 year old female presents for tissue sampling of 2 separate masses within the UPPER-OUTER LEFT breast. EXAM: ULTRASOUND GUIDED LEFT BREAST CORE NEEDLE BIOPSY X 2 COMPARISON:  Previous exam(s). PROCEDURE: I met with the patient and we discussed the procedure of ultrasound-guided biopsy, including benefits and alternatives. We discussed the high likelihood of a successful procedure. We discussed the risks of the procedure, including infection, bleeding, tissue injury, clip migration, and  inadequate sampling. Informed written consent was given. The usual time-out protocol was performed immediately prior to the procedure. ULTRASOUND GUIDED LEFT BREAST CORE NEEDLE BIOPSY #1 (0.5 cm mass at the 2 o'clock position 7 cm from the nipple-COIL clip): Lesion quadrant: UPPER-OUTER LEFT breast Using sterile technique and 1% Lidocaine as local anesthetic, under direct ultrasound visualization, a 12 gauge spring-loaded device was used to  perform biopsy of the 0.5 cm mass at the 2 o'clock position 7 cm from the nipple using a inferomedial approach. At the conclusion of the procedure a COIL shaped tissue marker clip was deployed into the biopsy cavity. Follow up 2 view mammogram was performed and dictated separately. ULTRASOUND GUIDED LEFT BREAST CORE NEEDLE BIOPSY #2 (0.9 cm mass at the 2 o'clock position 7 cm from the nipple-VENUS clip): Lesion quadrant: UPPER-OUTER LEFT breast Using sterile technique and 1% Lidocaine as local anesthetic, under direct ultrasound visualization, a 12 gauge spring-loaded device was used to perform biopsy of the 0.9 cm mass at the 2 o'clock position 7 cm from the nipple using a inferomedial approach. At the conclusion of the procedure a VENUS shaped tissue marker clip was deployed into the biopsy cavity. Follow up 2 view mammogram was performed and dictated separately. IMPRESSION: Ultrasound guided biopsy of 0.5 cm UPPER-OUTER LEFT breast mass (COIL clip). Ultrasound guided biopsy of 0.9 cm UPPER-OUTER LEFT breast mass (VENUS clip). No apparent complications. Electronically Signed: By: Harmon Pier M.D. On: 06/24/2022 10:38  [place] LT BREAST BX W LOC DEV EA ADD LESION IMG BX SPEC US GUIDE  Addendum Date: 06/27/2022   ADDENDUM REPORT: 06/27/2022 15:38 ADDENDUM: PATHOLOGY revealed: Breast, LEFT, needle core biopsy, upper outer, 2 o'clock, 7 cm fn (coil clip). INVASIVE WELL-DIFFERENTIATED DUCTAL ADENOCARCINOMA, GRADE 1 (1+1+1). LOW-GRADE DUCTAL CARCINOMA IN SITU, CRIBRIFORM TYPE WITHOUT  NECROSIS. TUBULE FORMATION: SCORE 1. NUCLEAR PLEOMORPHISM: SCORE 1. MITOTIC COUNT: SCORE 1. TOTAL SCORE: 3. OVERALL GRADE GRADE 1 (3/9). NEGATIVE FOR ANGIOLYMPHATIC INVASION. NEGATIVE FOR MICROCALCIFICATIONS. TUMOR MEASURES 7.5 MM IN GREATEST LINEAR EXTENT. Pathology results are CONCORDANT with imaging findings, per Dr. Laveda Abbe. PATHOLOGY revealed: Breast, LEFT, needle core biopsy, upper outer, 2 o'clock, 7 cm fn (venus clip). BENIGN BREAST SHOWING FIBROMATOID CHANGE WITH ADENOSIS. NEGATIVE FOR MICROCALCIFICATIONS. NEGATIVE FOR ATYPIA AND CARCINOMA. Pathology results are CONCORDANT with imaging findings, per Dr. Laveda Abbe. Pathology results and recommendations below were discussed with patient by telephone on 06/27/22. Patient reported biopsy site within normal limits with slight tenderness at the site. Post biopsy care instructions were reviewed, questions were answered and my direct phone number was provided to patient. Patient was instructed to call [facility] if any concerns or questions arise related to the biopsy. The patient was referred to the [facility] at [facility] with appointment on 07/06/2022. Pathology results reported by Lynett Grimes, RN on 06/27/2022. Electronically Signed   By: Harmon Pier M.D.   On: 06/27/2022 15:38   Result Date: 06/27/2022 CLINICAL DATA:  67 year old female presents for tissue sampling of 2 separate masses within the UPPER-OUTER LEFT breast. EXAM: ULTRASOUND GUIDED LEFT BREAST CORE NEEDLE BIOPSY X 2 COMPARISON:  Previous exam(s). PROCEDURE: I met with the patient and we discussed the procedure of ultrasound-guided biopsy, including benefits and alternatives. We discussed the high likelihood of a successful procedure. We discussed the risks of the procedure, including infection, bleeding, tissue injury, clip migration, and inadequate sampling. Informed written consent was given. The usual time-out  protocol was performed immediately prior to the procedure. ULTRASOUND GUIDED LEFT BREAST CORE NEEDLE BIOPSY #1 (0.5 cm mass at the 2 o'clock position 7 cm from the nipple-COIL clip): Lesion quadrant: UPPER-OUTER LEFT breast Using sterile technique and 1% Lidocaine as local anesthetic, under direct ultrasound visualization, a 12 gauge spring-loaded device was used to perform biopsy of the 0.5 cm mass at the 2 o'clock position 7 cm  from the nipple using a inferomedial approach. At the conclusion of the procedure a COIL shaped tissue marker clip was deployed into the biopsy cavity. Follow up 2 view mammogram was performed and dictated separately. ULTRASOUND GUIDED LEFT BREAST CORE NEEDLE BIOPSY #2 (0.9 cm mass at the 2 o'clock position 7 cm from the nipple-VENUS clip): Lesion quadrant: UPPER-OUTER LEFT breast Using sterile technique and 1% Lidocaine as local anesthetic, under direct ultrasound visualization, a 12 gauge spring-loaded device was used to perform biopsy of the 0.9 cm mass at the 2 o'clock position 7 cm from the nipple using a inferomedial approach. At the conclusion of the procedure a VENUS shaped tissue marker clip was deployed into the biopsy cavity. Follow up 2 view mammogram was performed and dictated separately. IMPRESSION: Ultrasound guided biopsy of 0.5 cm UPPER-OUTER LEFT breast mass (COIL clip). Ultrasound guided biopsy of 0.9 cm UPPER-OUTER LEFT breast mass (VENUS clip). No apparent complications. Electronically Signed: By: Harmon Pier M.D. On: 06/24/2022 10:38  MM CLIP PLACEMENT LEFT  Result Date: 06/24/2022 CLINICAL DATA:  Evaluate placement of COIL and VENUS biopsy clips following ultrasound-guided LEFT breast biopsies. EXAM: 3D DIAGNOSTIC LEFT MAMMOGRAM POST ULTRASOUND BIOPSY COMPARISON:  Previous exam(s). FINDINGS: 3D Mammographic images were obtained following ultrasound guided biopsy of the 0.5 cm LEFT breast mass at the 2 o'clock position 7 cm from the nipple (COIL clip) and the 0.9  cm LEFT breast mass at the 2 o'clock position 7 cm from the nipple (VENUS clip). The COIL biopsy marking clip is in expected position at the site of biopsy. The VENUS biopsy marking clip is in expected position at the site of biopsy. The 2 clips are separated by a distance of 1.2 cm. IMPRESSION: Appropriate positioning of the COIL shaped biopsy marking clip at the site of biopsy in the UPPER OUTER LEFT breast. Appropriate positioning of the VENUS shaped biopsy marking clip at the site of biopsy in the UPPER OUTER LEFT breast. Final Assessment: Post Procedure Mammograms for Marker Placement Electronically Signed   By: Harmon Pier M.D.   On: 06/24/2022 10:58  MM 3D DIAGNOSTIC MAMMOGRAM UNILATERAL LEFT BREAST  Result Date: 06/20/2022 CLINICAL DATA:  Possible asymmetry in the upper-outer left breast on a recent screening mammogram without comparisons. Status post bilateral breast reduction in the remote past. EXAM: DIGITAL DIAGNOSTIC UNILATERAL LEFT MAMMOGRAM WITH TOMOSYNTHESIS; ULTRASOUND LEFT BREAST LIMITED TECHNIQUE: Left digital diagnostic mammography and breast tomosynthesis was performed.; Targeted ultrasound examination of the left breast was performed. COMPARISON:  Previous exam(s). ACR Breast Density Category b: There are scattered areas of fibroglandular density. FINDINGS: There is fibroglandular tissue and focal distortion with calcifications in the upper-outer left breast in the area of recently suspected asymmetry. There is also a small,, circumscribed mass-like area within the fibroglandular tissue with an adjacent coarse, oval calcification. On physical exam, no mass is palpable in the upper outer left breast. Expected postreduction scars are demonstrated with no scar in the upper-outer left breast. Targeted ultrasound is performed, showing a 5 x 5 x 3 mm irregular, hypoechoic and echogenic mass in the 2 o'clock position of the left breast, 7 cm from the nipple, with associated adjacent increased  echogenicity with architectural distortion. This corresponds to the location of the focal distortion and calcifications seen mammographically. No internal blood flow was seen with power Doppler. Also demonstrated is a 9 x 4 x 4 mm bilobed, hypoechoic, circumscribed mass in the 2 o'clock position of the left breast, 7 cm from the nipple within  an area of dense glandular tissue. This corresponds to the oval mass-like area seen mammographically. Internal blood flow was seen with power Doppler. Ultrasound of the left axilla demonstrated normal appearing left axillary lymph nodes. IMPRESSION: 1. 5 mm mass with associated architectural distortion in the 2 o'clock position of the left breast. This is suspicious for malignancy. 2. 9 mm indeterminate mass in the 2 o'clock position of the left breast, 7 cm from the nipple, within an area of dense glandular tissue. RECOMMENDATION: Ultrasound-guided core needle biopsies of the 9 mm and 5 mm masses in the 2 o'clock position of the left breast, 7 cm from the nipple. This has been discussed with the patient and the biopsies have been scheduled at 9:30 a.m. on 06/24/2022. I have discussed the findings and recommendations with the patient. If applicable, a reminder letter will be sent to the patient regarding the next appointment. BI-RADS CATEGORY  4: Suspicious. Electronically Signed   By: Beckie Salts M.D.   On: 06/20/2022 12:02  [place] LIMITED ULTRASOUND INCLUDING AXILLA LEFT BREAST   Result Date: 06/20/2022 CLINICAL DATA:  Possible asymmetry in the upper-outer left breast on a recent screening mammogram without comparisons. Status post bilateral breast reduction in the remote past. EXAM: DIGITAL DIAGNOSTIC UNILATERAL LEFT MAMMOGRAM WITH TOMOSYNTHESIS; ULTRASOUND LEFT BREAST LIMITED TECHNIQUE: Left digital diagnostic mammography and breast tomosynthesis was performed.; Targeted ultrasound examination of the left breast was performed. COMPARISON:  Previous exam(s). ACR Breast  Density Category b: There are scattered areas of fibroglandular density. FINDINGS: There is fibroglandular tissue and focal distortion with calcifications in the upper-outer left breast in the area of recently suspected asymmetry. There is also a small,, circumscribed mass-like area within the fibroglandular tissue with an adjacent coarse, oval calcification. On physical exam, no mass is palpable in the upper outer left breast. Expected postreduction scars are demonstrated with no scar in the upper-outer left breast. Targeted ultrasound is performed, showing a 5 x 5 x 3 mm irregular, hypoechoic and echogenic mass in the 2 o'clock position of the left breast, 7 cm from the nipple, with associated adjacent increased echogenicity with architectural distortion. This corresponds to the location of the focal distortion and calcifications seen mammographically. No internal blood flow was seen with power Doppler. Also demonstrated is a 9 x 4 x 4 mm bilobed, hypoechoic, circumscribed mass in the 2 o'clock position of the left breast, 7 cm from the nipple within an area of dense glandular tissue. This corresponds to the oval mass-like area seen mammographically. Internal blood flow was seen with power Doppler. Ultrasound of the left axilla demonstrated normal appearing left axillary lymph nodes. IMPRESSION: 1. 5 mm mass with associated architectural distortion in the 2 o'clock position of the left breast. This is suspicious for malignancy. 2. 9 mm indeterminate mass in the 2 o'clock position of the left breast, 7 cm from the nipple, within an area of dense glandular tissue. RECOMMENDATION: Ultrasound-guided core needle biopsies of the 9 mm and 5 mm masses in the 2 o'clock position of the left breast, 7 cm from the nipple. This has been discussed with the patient and the biopsies have been scheduled at 9:30 a.m. on 06/24/2022. I have discussed the findings and recommendations with the patient. If applicable, a reminder letter  will be sent to the patient regarding the next appointment. BI-RADS CATEGORY  4: Suspicious. Electronically Signed   By: Beckie Salts M.D.   On: 06/20/2022 12:02     IMPRESSION: *** {DIAGNOSIS HERE}  Patient  will be a good candidate for breast conservation with radiotherapy to the left breast. We discussed the general course of radiation, potential side effects, and toxicities with radiation and the patient is interested in this approach. ***   PLAN:  *** (copy notes from board at conference)   ------------------------------------------------  Billie Lade, PhD, MD  This document serves as a record of services personally performed by Antony Blackbird, MD. It was created on his behalf by Neena Rhymes, a trained medical scribe. The creation of this record is based on the scribe's personal observations and the provider's statements to them. This document has been checked and approved by the attending provider.

## 2022-07-06 ENCOUNTER — Encounter: Payer: Self-pay | Admitting: General Practice

## 2022-07-06 ENCOUNTER — Ambulatory Visit
Admission: RE | Admit: 2022-07-06 | Discharge: 2022-07-06 | Disposition: A | Discharge status: Home / Self Care | Payer: Medicare PPO | Source: Ambulatory Visit | Attending: Radiation Oncology | Admitting: Radiation Oncology

## 2022-07-06 ENCOUNTER — Ambulatory Visit: Payer: Self-pay | Admitting: Surgery

## 2022-07-06 ENCOUNTER — Inpatient Hospital Stay: Payer: Medicare PPO | Attending: Hematology and Oncology | Admitting: Hematology and Oncology

## 2022-07-06 ENCOUNTER — Encounter: Payer: Self-pay | Admitting: *Deleted

## 2022-07-06 ENCOUNTER — Inpatient Hospital Stay (HOSPITAL_BASED_OUTPATIENT_CLINIC_OR_DEPARTMENT_OTHER): Payer: Medicare PPO | Admitting: Genetic Counselor

## 2022-07-06 ENCOUNTER — Ambulatory Visit: Payer: Medicare PPO | Admitting: Physical Therapy

## 2022-07-06 ENCOUNTER — Inpatient Hospital Stay: Payer: Medicare PPO

## 2022-07-06 ENCOUNTER — Other Ambulatory Visit: Payer: Self-pay

## 2022-07-06 VITALS — BP 134/62 | HR 78 | Temp 97.9°F | Resp 17 | Wt 125.0 lb

## 2022-07-06 DIAGNOSIS — C50412 Malignant neoplasm of upper-outer quadrant of left female breast: Secondary | ICD-10-CM | POA: Diagnosis not present

## 2022-07-06 DIAGNOSIS — Z170 Estrogen receptor positive status [ER+]: Secondary | ICD-10-CM

## 2022-07-06 DIAGNOSIS — F17210 Nicotine dependence, cigarettes, uncomplicated: Secondary | ICD-10-CM

## 2022-07-06 DIAGNOSIS — Z800 Family history of malignant neoplasm of digestive organs: Secondary | ICD-10-CM

## 2022-07-06 DIAGNOSIS — C50912 Malignant neoplasm of unspecified site of left female breast: Secondary | ICD-10-CM

## 2022-07-06 DIAGNOSIS — Z803 Family history of malignant neoplasm of breast: Secondary | ICD-10-CM

## 2022-07-06 LAB — CBC WITH DIFFERENTIAL (CANCER CENTER ONLY)
Abs Immature Granulocytes: 0.02 10*3/uL (ref 0.00–0.07)
Basophils Absolute: 0 10*3/uL (ref 0.0–0.1)
Basophils Relative: 0 %
Eosinophils Absolute: 0 10*3/uL (ref 0.0–0.5)
Eosinophils Relative: 0 %
HCT: 41.2 % (ref 36.0–46.0)
Hemoglobin: 14 g/dL (ref 12.0–15.0)
Immature Granulocytes: 0 %
Lymphocytes Relative: 19 %
Lymphs Abs: 1.3 10*3/uL (ref 0.7–4.0)
MCH: 36.8 pg — ABNORMAL HIGH (ref 26.0–34.0)
MCHC: 34 g/dL (ref 30.0–36.0)
MCV: 108.4 fL — ABNORMAL HIGH (ref 80.0–100.0)
Monocytes Absolute: 0.5 10*3/uL (ref 0.1–1.0)
Monocytes Relative: 7 %
Neutro Abs: 5.1 10*3/uL (ref 1.7–7.7)
Neutrophils Relative %: 74 %
Platelet Count: 328 10*3/uL (ref 150–400)
RBC: 3.8 MIL/uL — ABNORMAL LOW (ref 3.87–5.11)
RDW: 13.8 % (ref 11.5–15.5)
WBC Count: 6.9 10*3/uL (ref 4.0–10.5)
nRBC: 0 % (ref 0.0–0.2)

## 2022-07-06 LAB — CMP (CANCER CENTER ONLY)
ALT: 16 U/L (ref 0–44)
AST: 17 U/L (ref 15–41)
Albumin: 4.2 g/dL (ref 3.5–5.0)
Alkaline Phosphatase: 104 U/L (ref 38–126)
Anion gap: 7 (ref 5–15)
BUN: 19 mg/dL (ref 8–23)
CO2: 29 mmol/L (ref 22–32)
Calcium: 9.7 mg/dL (ref 8.9–10.3)
Chloride: 104 mmol/L (ref 98–111)
Creatinine: 0.83 mg/dL (ref 0.44–1.00)
GFR, Estimated: >60 mL/min (ref >60)
Glucose, Bld: 107 mg/dL — ABNORMAL HIGH (ref 70–99)
Potassium: 4.7 mmol/L (ref 3.5–5.1)
Sodium: 140 mmol/L (ref 135–145)
Total Bilirubin: 0.6 mg/dL (ref 0.3–1.2)
Total Protein: 6.8 g/dL (ref 6.5–8.1)

## 2022-07-06 LAB — GENETIC SCREENING ORDER

## 2022-07-06 NOTE — Assessment & Plan Note (Signed)
06/24/2022:Screening mammogram detected left breast asymmetry UOQ.  Two abnormalities, 2 o'clock position 5 mm biopsy: Grade 1 IDC with DCIS ER 100%, PR 100%, HER2 negative 1+, Ki-67 5%; additional area 9 mm biopsy: Benign, axilla negative  Pathology and radiology counseling:Discussed with the patient, the details of pathology including the type of breast cancer,the clinical staging, the significance of ER, PR and HER-2/neu receptors and the implications for treatment. After reviewing the pathology in detail, we proceeded to discuss the different treatment options between surgery, radiation, chemotherapy, antiestrogen therapies.  Recommendations: 1. Breast conserving surgery followed by 2. Oncotype DX testing based on final tumor characteristics 3. Adjuvant radiation therapy followed by 4. Adjuvant antiestrogen therapy  Oncotype counseling: I discussed Oncotype DX test. I explained to the patient that this is a 21 gene panel to evaluate patient tumors DNA to calculate recurrence score. This would help determine whether patient has high risk or low risk breast cancer. She understands that if her tumor was found to be high risk, she would benefit from systemic chemotherapy. If low risk, no need of chemotherapy.  Return to clinic after surgery to discuss final pathology report and then determine if Oncotype DX testing will need to be sent.

## 2022-07-06 NOTE — Progress Notes (Signed)
[facility] Psychosocial Distress Screening Spiritual Care  Met with Michele Swanson and her husband in [facility] to introduce Support Center team/resources, reviewing distress screen per protocol.  The patient scored a 1 on the Psychosocial Distress Thermometer which indicates mild distress. Also assessed for distress and other psychosocial needs.      07/06/2022    4:48 PM  ONCBCN DISTRESS SCREENING  Screening Type Initial Screening  Distress experienced in past week (1-10) 1  Emotional problem type Nervousness/Anxiety  Physical Problem type Skin dry/itchy  Referral to support programs Yes    Chaplain and patient discussed common feelings and emotions when being diagnosed with cancer, and the importance of support during treatment.  Chaplain informed patient of the support team and support services at [facility].  Chaplain provided contact information and encouraged patient to call with any questions or concerns.  Follow up needed: Yes.  [name] welcomes a follow-up phone ca in ca two weeks, as she is just beginning to process her diagnosis and treatment plan.   [address] [place], [place], [facility] Pager [phone] Voicemail [phone]

## 2022-07-06 NOTE — Progress Notes (Signed)
[facility] CONSULT NOTE  Patient Care Team: [name], MD as PCP - General (Family Medicine) Tat, Octaviano Batty, DO as Consulting Physician (Neurology) Pershing Proud, RN as Oncology Nurse Navigator Donnelly Angelica, RN as Oncology Nurse Navigator [name], MD as Consulting Physician (Hematology and Oncology) Harriette Bouillon, MD as Consulting Physician (General Surgery) Antony Blackbird, MD as Consulting Physician (Radiation Oncology)  CHIEF COMPLAINTS/PURPOSE OF CONSULTATION:  Newly diagnosed breast cancer  HISTORY OF PRESENTING ILLNESS:  Michele Swanson 67 y.o. female is here because of recent diagnosis of left breast cancer.  Routine screening mammogram detected left breast asymmetry.  2 biopsies were performed biopsy of the 5 mm area showed grade 1 IDC with DCIS that was ER/PR positive HER2 negative with a Ki-67 of 5%.  Additional area of biopsy was benign.  She was presented this morning to the multidisciplinary tumor board and she is here today accompanied by her husband to discuss her treatment plan.  I reviewed her records extensively and collaborated the history with the patient.  SUMMARY OF ONCOLOGIC HISTORY: Oncology History  Malignant neoplasm of upper-outer quadrant of left breast in female, estrogen receptor positive (HCC)  06/24/2022 Initial Diagnosis   Screening mammogram detected left breast asymmetry UOQ.  Two abnormalities, 2 o'clock position 5 mm biopsy: Grade 1 IDC with DCIS ER 100%, PR 100%, HER2 negative 1+, Ki-67 5%; additional area 9 mm biopsy: Benign, axilla negative   07/06/2022 Cancer Staging   Staging form: Breast, AJCC 8th Edition - Clinical stage from 07/06/2022: Stage IA (cT1a, cN0, cM0, G1, ER+, PR+, HER2-) - Signed by [name], MD on 07/06/2022 Stage prefix: Initial diagnosis Histologic grading system: 3 grade system      MEDICAL HISTORY:  Past Medical History:  Diagnosis Date   Actinic keratoses    Anxiety    Depression     Fall    Hypercholesterolemia    Hypertension    Insomnia    Low back pain    Tremor     SURGICAL HISTORY: Past Surgical History:  Procedure Laterality Date   BREAST BIOPSY Left 06/24/2022   [place] LT BREAST BX W LOC DEV 1ST LESION IMG BX SPEC US GUIDE 06/24/2022 [facility]   BREAST BIOPSY Left 06/24/2022   [place] LT BREAST BX W LOC DEV EA ADD LESION IMG BX SPEC US GUIDE 06/24/2022 [facility]   REDUCTION MAMMAPLASTY     SHOULDER SURGERY Right 10/2008   pinning    SOCIAL HISTORY: Social History   Socioeconomic History   Marital status: Married    Spouse name: Not on file   Number of children: 0   Years of education: 12   Highest education level: Not on file  Occupational History    Comment: retired, 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  Tobacco Use   Smoking status: Every Day    Packs/day: .5    Types: Cigarettes   Smokeless tobacco: Never   Tobacco comments:    10/23/15 trying to cut back  Vaping Use   Vaping Use: Never used  Substance and Sexual Activity   Alcohol use: Yes    Alcohol/week: 2.0 standard drinks of alcohol    Types: 2 Glasses of wine per week   Drug use: No   Sexual activity: Not on file  Other Topics Concern   Not on file  Social History Narrative   Caffeine - Coke, 1 daily   Right handed    Two story home   Social Determinants of  Health   Financial Resource Strain: Not on file  Food Insecurity: Not on file  Transportation Needs: Not on file  Physical Activity: Not on file  Stress: Not on file  Social Connections: Not on file  Intimate Partner Violence: Not on file    FAMILY HISTORY: Family History  Problem Relation Age of Onset   Dementia Mother    Heart disease Father    Atrial fibrillation Brother    Heart disease Paternal Grandmother     ALLERGIES:  is allergic to [employer] tartrate], hydrocodone-acetaminophen, and voltaren [diclofenac sodium].  MEDICATIONS:  Current Outpatient Medications  Medication Sig Dispense Refill    amLODipine (NORVASC) 5 MG tablet Take 5 mg by mouth daily.     Ascorbic Acid (VITAMIN C) 100 MG tablet Take 100 mg by mouth daily.     Cholecalciferol (VITAMIN D3) 50 MCG (2000 UT) capsule Take 2,000 Units by mouth daily.     Coenzyme Q10 (COQ10) 100 MG CAPS Take 1 tablet by mouth daily.      Eszopiclone (ESZOPICLONE) 3 MG TABS Take 3 mg by mouth at bedtime. Take immediately before bedtime     folic acid (FOLVITE) 400 MCG tablet Take 400 mcg by mouth daily.      hydrochlorothiazide (HYDRODIURIL) 12.5 MG tablet Take 12.5 mg by mouth daily.     irbesartan (AVAPRO) 150 MG tablet Take 150 mg by mouth daily.     MITIGARE 0.6 MG CAPS Take by mouth as needed. Take as directed     primidone (MYSOLINE) 50 MG tablet TAKE ONE TABLET AT BEDTIME. 90 tablet 1   trimethoprim-polymyxin b (POLYTRIM) ophthalmic solution as needed.     Venlafaxine HCl 75 MG TB24 Take 75 mg by mouth in the morning and at bedtime.      vitamin B-12 (CYANOCOBALAMIN) 1000 MCG tablet Take 1,000 mcg by mouth daily.     No current facility-administered medications for this visit.    REVIEW OF SYSTEMS:   Constitutional: Denies fevers, chills or abnormal night sweats Breast:  Denies any palpable lumps or discharge All other systems were reviewed with the patient and are negative.  PHYSICAL EXAMINATION: ECOG PERFORMANCE STATUS: 1 - Symptomatic but completely ambulatory  Vitals:   07/06/22 1304  BP: 134/62  Pulse: 78  Resp: 17  Temp: 97.9 F (36.6 C)  SpO2: 93%   Filed Weights   07/06/22 1304  Weight: 125 lb (56.7 kg)    GENERAL:alert, no distress and comfortable    LABORATORY DATA:  I have reviewed the data as listed Lab Results  Component Value Date   WBC 6.9 07/06/2022   HGB 14.0 07/06/2022   HCT 41.2 07/06/2022   MCV 108.4 (H) 07/06/2022   PLT 328 07/06/2022   Lab Results  Component Value Date   NA 140 07/06/2022   K 4.7 07/06/2022   CL 104 07/06/2022   CO2 29 07/06/2022    RADIOGRAPHIC STUDIES: I  have personally reviewed the radiological reports and agreed with the findings in the report.  ASSESSMENT AND PLAN:  Malignant neoplasm of upper-outer quadrant of left breast in female, estrogen receptor positive (HCC) 06/24/2022:Screening mammogram detected left breast asymmetry UOQ.  Two abnormalities, 2 o'clock position 5 mm biopsy: Grade 1 IDC with DCIS ER 100%, PR 100%, HER2 negative 1+, Ki-67 5%; additional area 9 mm biopsy: Benign, axilla negative  Pathology and radiology counseling:Discussed with the patient, the details of pathology including the type of breast cancer,the clinical staging, the significance of ER,  PR and HER-2/neu receptors and the implications for treatment. After reviewing the pathology in detail, we proceeded to discuss the different treatment options between surgery, radiation, antiestrogen therapies.  Recommendations: 1. Breast conserving surgery followed by 2. Adjuvant radiation therapy followed by 3. Adjuvant antiestrogen therapy with anastrozole 1 mg daily x 5 years    Return to clinic after surgery to discuss final pathology report and then finalize adjuvant treatment plan.   All questions were answered. The patient knows to call the clinic with any problems, questions or concerns.    Tamsen Meek, MD 07/06/22

## 2022-07-06 NOTE — Research (Signed)
[employer] 2021-05 - Specimen Collection Study to Evaluate Biomarkers in Subjects with Cancer   Patient Michele Swanson was identified by Dr Pamelia Hoit as a potential candidate for the above listed study.  This Clinical Research Nurse met with Michele Swanson, [id number], on 07/06/22 in a manner and location that ensures patient privacy to discuss participation in the above listed research study.  Patient is Accompanied by her husband .  A copy of the informed consent document with embedded HIPAA language was provided to the patient.  Patient reads, speaks, and understands [place].   Patient was provided with the business card of this Nurse and encouraged to contact the research team with any questions.  Approximately 10 minutes were spent with the patient reviewing the informed consent documents.  Patient was provided the option of taking informed consent documents home to review and was encouraged to review at their convenience with their support network, including other care providers. Patient took the consent documents home to review.  Patient agreed to follow-up from the research team early next week.  Juanita Laster, RN, BSN, CPN Clinical Research Nurse I [phone]  07/06/2022 2:58 PM

## 2022-07-07 ENCOUNTER — Ambulatory Visit: Payer: Self-pay | Admitting: Physical Therapy

## 2022-07-07 ENCOUNTER — Encounter: Payer: Self-pay | Admitting: Genetic Counselor

## 2022-07-07 NOTE — Progress Notes (Signed)
REFERRING PROVIDER: [name], MD  PRIMARY PROVIDER:  [name], MD  PRIMARY REASON FOR VISIT:  1. Malignant neoplasm of upper-outer quadrant of left breast in female, estrogen receptor positive (HCC)   2. Family history of breast cancer   3. Family history of colon cancer     HISTORY OF PRESENT ILLNESS:   Michele Swanson, a 67 y.o. female, was seen for a [facility] cancer genetics consultation at the request of Dr. Pamelia Hoit due to a personal and family history of cancer.  Michele Swanson presents to clinic today to discuss the possibility of a hereditary predisposition to cancer, to discuss genetic testing, and to further clarify her future cancer risks, as well as potential cancer risks for family members.   In May 2024, at the age of 69, Michele Swanson was diagnosed with invasive ductal carcinoma of the left breast (ER/PR positive, HER2 negative).  CANCER HISTORY:  Oncology History  Malignant neoplasm of upper-outer quadrant of left breast in female, estrogen receptor positive (HCC)  06/24/2022 Initial Diagnosis   Screening mammogram detected left breast asymmetry UOQ.  Two abnormalities, 2 o'clock position 5 mm biopsy: Grade 1 IDC with DCIS ER 100%, PR 100%, HER2 negative 1+, Ki-67 5%; additional area 9 mm biopsy: Benign, axilla negative   07/06/2022 Cancer Staging   Staging form: Breast, AJCC 8th Edition - Clinical stage from 07/06/2022: Stage IA (cT1a, cN0, cM0, G1, ER+, PR+, HER2-) - Signed by [name], MD on 07/06/2022 Stage prefix: Initial diagnosis Histologic grading system: 3 grade system      Past Medical History:  Diagnosis Date   Actinic keratoses    Anxiety    Depression    Fall    Hypercholesterolemia    Hypertension    Insomnia    Low back pain    Tremor     Past Surgical History:  Procedure Laterality Date   BREAST BIOPSY Left 06/24/2022   [place] LT BREAST BX W LOC DEV 1ST LESION IMG BX SPEC US GUIDE 06/24/2022 [facility]   BREAST BIOPSY Left 06/24/2022    [place] LT BREAST BX W LOC DEV EA ADD LESION IMG BX SPEC US GUIDE 06/24/2022 [facility]   REDUCTION MAMMAPLASTY     SHOULDER SURGERY Right 10/2008   pinning    Social History   Socioeconomic History   Marital status: Married    Spouse name: Not on file   Number of children: 0   Years of education: 12   Highest education level: Not on file  Occupational History    Comment: retired, 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  Tobacco Use   Smoking status: Every Day    Packs/day: .5    Types: Cigarettes   Smokeless tobacco: Never   Tobacco comments:    10/23/15 trying to cut back  Vaping Use   Vaping Use: Never used  Substance and Sexual Activity   Alcohol use: Yes    Alcohol/week: 2.0 standard drinks of alcohol    Types: 2 Glasses of wine per week   Drug use: No   Sexual activity: Not on file  Other Topics Concern   Not on file  Social History Narrative   Caffeine - Coke, 1 daily   Right handed    Two story home   Social Determinants of Health   Financial Resource Strain: Not on file  Food Insecurity: Not on file  Transportation Needs: Not on file  Physical Activity: Not on file  Stress: Not on file  Social Connections: Not  on file     FAMILY HISTORY:  We obtained a detailed, 4-generation family history.  Significant diagnoses are listed below: Family History  Problem Relation Age of Onset   Dementia Mother    Heart disease Father    Atrial fibrillation Brother    Breast cancer Maternal Aunt 51 - 89   Colon cancer Maternal Uncle    Heart disease Paternal Grandmother    Ovarian cancer Cousin        possibly uterine, maternal first cousin     Michele Swanson had five maternal uncles and one uncle was diagnosed with colon cancer at an unknown age, he is deceased. She had five maternal aunts and one aunt was diagnosed with breast cancer in her late 42s, she is deceased. Michele Swanson maternal first cousin was diagnosed with ovarian cancer (possibly uterine cancer). Michele Swanson is  unaware of previous family history of genetic testing for hereditary cancer risks. There is no reported Ashkenazi Jewish ancestry.   GENETIC COUNSELING ASSESSMENT: Michele Swanson is a 67 y.o. female with a personal and family history of cancer which is somewhat suggestive of a hereditary predisposition to cancer. We, therefore, discussed and recommended the following at today's visit.   DISCUSSION: We discussed that 5 - 10% of cancer is hereditary, with most cases of breast cancer associated with BRCA1/2.  There are other genes that can be associated with hereditary breast cancer syndromes.  We discussed that testing is beneficial for several reasons including knowing how to follow individuals after completing their treatment, identifying whether potential treatment options would be beneficial, and understanding if other family members could be at risk for cancer and allowing them to undergo genetic testing.   We reviewed the characteristics, features and inheritance patterns of hereditary cancer syndromes. We also discussed genetic testing, including the appropriate family members to test, the process of testing, insurance coverage and turn-around-time for results. We discussed the implications of a negative, positive, carrier and/or variant of uncertain significant result. We recommended Michele Swanson pursue genetic testing for a panel that includes genes associated with breast, colon, and ovarian cancer.   Michele Swanson elected to have [facility] Custom Panel. The Custom Hereditary Cancers Panel offered by [facility] includes sequencing and/or deletion duplication testing of the following 43 genes: APC, ATM, AXIN2, BAP1, BARD1, BMPR1A, BRCA1, BRCA2, BRIP1, CDH1, CDK4, CDKN2A (p14ARF and p16INK4a only), CHEK2, CTNNA1, EPCAM (Deletion/duplication testing only), FH, GREM1 (promoter region duplication testing only), HOXB13, KIT, MBD4, MEN1, MLH1, MSH2, MSH3, MSH6, MUTYH, NF1, NHTL1, PALB2, PDGFRA, PMS2, POLD1, POLE, PTEN,  RAD51C, RAD51D, SMAD4, SMARCA4. STK11, TP53, TSC1, TSC2, and VHL.  Based on Michele Swanson's personal and family history of cancer, she meets medical criteria for genetic testing. Despite that she meets criteria, she may still have an out of pocket cost. We discussed that if her out of pocket cost for testing is over $100, the laboratory will call and confirm whether she wants to proceed with testing.  If the out of pocket cost of testing is less than $100 she will be billed by the genetic testing laboratory.   PLAN: After considering the risks, benefits, and limitations, Michele Swanson provided informed consent to pursue genetic testing and the blood sample was sent to [facility] for analysis of the Custom Panel. Results should be available within approximately 2-3 weeks' time, at which point they will be disclosed by telephone to Michele Swanson, as will any additional recommendations warranted by these results. Michele Swanson will receive a summary of  her genetic counseling visit and a copy of her results once available. This information will also be available in Epic.   Michele Swanson questions were answered to her satisfaction today. Our contact information was provided should additional questions or concerns arise. Thank you for the referral and allowing [place] to share in the care of your patient.   Lalla Brothers, MS, [facility] Genetic Counselor [email or web address] (P) [phone]  The patient was seen for a total of 20 minutes in face-to-face genetic counseling.  The patient brought her husband. Drs. Pamelia Hoit and/or Mosetta Putt were available to discuss this case as needed.   _______________________________________________________________________ For Office Staff:  Number of people involved in session: 2 Was an Intern/ student involved with case: no

## 2022-07-08 ENCOUNTER — Ambulatory Visit: Payer: Self-pay | Admitting: Rehabilitative and Restorative Service Providers"

## 2022-07-08 ENCOUNTER — Ambulatory Visit: Payer: Medicare PPO | Attending: Surgery | Admitting: Rehabilitative and Restorative Service Providers"

## 2022-07-08 ENCOUNTER — Encounter: Payer: Self-pay | Admitting: Rehabilitative and Restorative Service Providers"

## 2022-07-08 ENCOUNTER — Other Ambulatory Visit: Payer: Self-pay

## 2022-07-08 DIAGNOSIS — R252 Cramp and spasm: Secondary | ICD-10-CM | POA: Insufficient documentation

## 2022-07-08 DIAGNOSIS — R2689 Other abnormalities of gait and mobility: Secondary | ICD-10-CM | POA: Diagnosis not present

## 2022-07-08 DIAGNOSIS — M5459 Other low back pain: Secondary | ICD-10-CM | POA: Insufficient documentation

## 2022-07-08 DIAGNOSIS — M6281 Muscle weakness (generalized): Secondary | ICD-10-CM | POA: Diagnosis not present

## 2022-07-08 NOTE — Therapy (Addendum)
OUTPATIENT PHYSICAL THERAPY EVALUATION AND LATE ENTRY DISCHARGE SUMMARY   Patient Name: Michele Swanson MRN: [id number] DOB:09/04/1955, 67 y.o., female Today's Date: 07/08/2022  END OF SESSION:  PT End of Session - 07/08/22 0944     Visit Number 1    Date for PT Re-Evaluation 09/02/22    Authorization Type [employer]    Progress Note Due on Visit 10    PT Start Time 0932    PT Stop Time 1010    PT Time Calculation (min) 38 min    Activity Tolerance Patient tolerated treatment well    Behavior During Therapy WFL for tasks assessed/performed             Past Medical History:  Diagnosis Date   Actinic keratoses    Anxiety    Depression    Fall    Hypercholesterolemia    Hypertension    Insomnia    Low back pain    Tremor    Past Surgical History:  Procedure Laterality Date   BREAST BIOPSY Left 06/24/2022   [place] LT BREAST BX W LOC DEV 1ST LESION IMG BX SPEC US GUIDE 06/24/2022 [facility]   BREAST BIOPSY Left 06/24/2022   [place] LT BREAST BX W LOC DEV EA ADD LESION IMG BX SPEC US GUIDE 06/24/2022 [facility]   REDUCTION MAMMAPLASTY     SHOULDER SURGERY Right 10/2008   pinning   Patient Active Problem List   Diagnosis Date Noted   Malignant neoplasm of upper-outer quadrant of left breast in female, estrogen receptor positive (HCC) 07/04/2022   Tremor 10/23/2015    PCP: [name], MD  REFERRING PROVIDER: Harriette Bouillon, MD  REFERRING DIAG: R26.89 (ICD-10-CM) - Balance disorder R53.1 (ICD-10-CM) - General weakness Z85.3 (ICD-10-CM) - History of left breast cancer  THERAPY DIAG:  Other abnormalities of gait and mobility - Plan: PT plan of care cert/re-cert  Muscle weakness (generalized) - Plan: PT plan of care cert/re-cert  Other low back pain - Plan: PT plan of care cert/re-cert  Cramp and spasm - Plan: PT plan of care cert/re-cert  Rationale for Evaluation and Treatment: Rehabilitation  ONSET DATE: "A couple years or more"  SUBJECTIVE:    SUBJECTIVE STATEMENT: Pt reports that she has had problems with balance for "a couple years of more".  Patient states that she occasionally has some dizziness.  States that when she turns around too quick, she get some dizziness.  Pt reports "I have a lot of back issues and in my knees.  Husband present during evaluation and reports that patient furniture walks.  PERTINENT HISTORY: invasive ductal carcinoma of the left breast (ER/PR positive, HER2 negative), low back pain, Tremor/Ataxia, Right shoulder surgery,  PAIN:  Are you having pain? Yes: NPRS scale: 5/10 Pain location: back and left knee Pain description: aching, cramping in foot Aggravating factors: standing when sitting for too long Relieving factors: unknown  PRECAUTIONS: Fall  WEIGHT BEARING RESTRICTIONS: No  FALLS:  Has patient fallen in last 6 months? Yes. Number of falls pt reports a couple.  LIVING ENVIRONMENT: Lives with: lives with their spouse Lives in: House/apartment Stairs: Yes: Internal: 14 steps; on right going up and External: 6 steps; bilateral but cannot reach both Has following equipment at home: Dan Humphreys - 2 wheeled, Environmental consultant - 4 wheeled, and Grab bars  OCCUPATION: Retired  PLOF: Independent with community mobility with device and Leisure: watching tv  PATIENT GOALS: To be able to walk and go to [employer].  NEXT  MD VISIT: Will be following up with Oncologist  OBJECTIVE:   DIAGNOSTIC FINDINGS:  MRI on 05/11/2019: IMPRESSION: 1. No acute intracranial abnormality. 2. Mild chronic small vessel ischemic disease most notable in the pons.  PATIENT SURVEYS:  ABC scale Total ABC score: 430 / 1600 = 26.9 % self confident  COGNITION: Overall cognitive status: Within functional limits for tasks assessed     SENSATION: Pt reports she sometimes gets numbness and tingling in left calf   POSTURE: rounded shoulders and forward head  PALPATION:  Eval:  Noted a bump/knot on back of left knee.  Muscle  spasms noted on right lumbar multifidi.   UPPER EXTREMITY ROM:  Pt with painful end range shoulder flexion and abduction from injury years ago when she was walking her large down and it pulled her over and she fell.  UPPER EXTREMITY/LOWER EXTREMITY MMT:  Eval: Bilateral UE strength of 4/5 Bilateral LE strength grossly 4/5  FUNCTIONAL TESTS:  Eval: 5 times sit to stand: 24.83 sec with use of UE MCTSIB: Condition 1: Avg of 3 trials: 21 sec, Condition 2: Avg of 3 trials: 6 sec, Condition 3: Avg of 3 trials: 2 sec, Condition 4: Avg of 3 trials: 0 sec, and Total Score: 29/120  GAIT: Distance walked: >100 ft Assistive device utilized: Environmental consultant - 2 wheeled Level of assistance: SBA Comments: Pt with shuffling gait pattern noted with unsteadiness  VESTIBULAR: Saccades with very minimal saccades noted Occulomotor tracking is WFL Head thrust positive for nystagmus on bilateral sides    TODAY'S TREATMENT:                                                                                                                              DATE: 07/08/2022  Review of HEP (see below)  PATIENT EDUCATION:  Education details: Issued HEP Person educated: Patient and Spouse Education method: Explanation, Demonstration, and Handouts Education comprehension: verbalized understanding and returned demonstration  HOME EXERCISE PROGRAM: Access Code: [email or web address] URL: [email or web address] Date: 07/08/2022 Prepared by: Clydie Braun Daniyal Tabor  Exercises - Standing with Head Rotation  - 1 x daily - 7 x weekly - 3 sets - 10 reps - Standing with Head Nod  - 1 x daily - 7 x weekly - 3 sets - 10 reps - Standing Weight Shift  - 1 x daily - 7 x weekly - 3 sets - 10 reps  ASSESSMENT:  CLINICAL IMPRESSION: Patient is a 67 y.o. female who was seen today for physical therapy evaluation and treatment for balance impairment. Patient's PLOF is ambulating with a walker.  Patient has a reported history of dizziness  and states that she has had decreased balance for a while.  Per report, patient used to have large dogs and she had multiple fractures when she was pulled down while walking her dogs.  Patient admits to a couple of falls in the past 6 months.  Patient states that she went to see Dr Tat before, but has  not followed back up and states that they may be wanting to go for a second opinion.  Encouraged patient to follow back up with a Neurologist for another work-up, as she has stopped one of her medications for tremors and continues to have symptoms.  Patient with decreased balance, back and left leg pain, antalgic gait, vestibular impairments, and difficulty performing functional tasks.  Patient would benefit from skilled PT to address her functional impairments.  OBJECTIVE IMPAIRMENTS: Abnormal gait, decreased balance, decreased coordination, difficulty walking, decreased strength, dizziness, increased muscle spasms, and pain.   ACTIVITY LIMITATIONS: carrying, lifting, bending, standing, squatting, stairs, and transfers  PARTICIPATION LIMITATIONS: community activity  PERSONAL FACTORS: Past/current experiences, Time since onset of injury/illness/exacerbation, and 3+ comorbidities: breast cancer, tremors/ataxia, low back pain  are also affecting patient's functional outcome.   REHAB POTENTIAL: Good  CLINICAL DECISION MAKING: Evolving/moderate complexity  EVALUATION COMPLEXITY: Moderate   GOALS: Goals reviewed with patient? Yes  SHORT TERM GOALS: Target date: 07/29/2022 Pt will be independent with initial HEP. Baseline: Goal status: INITIAL  2.  Patient will participate in Hallpike-Dix and perform canalith repositioning, if indicated. Baseline:  Goal status: INITIAL   LONG TERM GOALS: Target date: 09/02/2022  Patient/spouse will be independent with advanced HEP. Baseline:  Goal status: INITIAL  2.  Patient will increase modified CTSIB to at least 60/120 to demonstrate improved  balance. Baseline: 29/120 Goal status: INITIAL  3.  Patient will increase bilateral UE/LE strength to grossly 4+/5 throughout to allow improved ease with transfers and other mobility Baseline:  Goal status: INITIAL  4.  Patient will be able to ambulate around various environments for at least 10 minutes with LRAD without increased dizziness or pain to allow for community outings.  Baseline:  Goal status: INITIAL  5.  Patient will increase The Activities of Balance Confidence to at least 65% to demonstrate decreased fear of falling. Baseline: 26.9% confident Goal status: INITIAL    PLAN:  PT FREQUENCY: 2x/week  PT DURATION: 8 weeks  PLANNED INTERVENTIONS: Therapeutic exercises, Therapeutic activity, Neuromuscular re-education, Balance training, Gait training, Patient/Family education, Self Care, Joint mobilization, Joint manipulation, Stair training, Vestibular training, Canalith repositioning, Aquatic Therapy, Dry Needling, Electrical stimulation, Spinal manipulation, Spinal mobilization, Cryotherapy, Moist heat, Taping, Traction, Ultrasound, Ionotophoresis 4mg /ml Dexamethasone, Manual therapy, and Re-evaluation  PLAN FOR NEXT SESSION: Assess and progress HEP as indicated, [employer], balance, dynamic gait   [place], PT 07/08/2022, 11:42 AM   [facility] [address] [place], [place] [address] Phone # [phone] Fax [phone]   PHYSICAL THERAPY DISCHARGE SUMMARY  Pt has not returned for further PT sessions secondary to her cancer treatment appointments.  Patient agrees to discharge. Patient goals were not met. Patient is being discharged due to not returning since the last visit.  Clydie Braun Damond Borchers, PT, DPT 08/26/22, 10:14 AM

## 2022-07-12 ENCOUNTER — Telehealth: Payer: Self-pay

## 2022-07-12 ENCOUNTER — Other Ambulatory Visit: Payer: Self-pay | Admitting: Surgery

## 2022-07-12 ENCOUNTER — Other Ambulatory Visit: Payer: Self-pay | Admitting: *Deleted

## 2022-07-12 DIAGNOSIS — C50912 Malignant neoplasm of unspecified site of left female breast: Secondary | ICD-10-CM

## 2022-07-12 DIAGNOSIS — Z170 Estrogen receptor positive status [ER+]: Secondary | ICD-10-CM

## 2022-07-12 DIAGNOSIS — C50412 Malignant neoplasm of upper-outer quadrant of left female breast: Secondary | ICD-10-CM

## 2022-07-12 NOTE — Telephone Encounter (Signed)
[employer] 2021-05 - Specimen Collection Study to Evaluate Biomarkers in Subjects with Cancer   Followed up with patient regarding study participation; it does not fit into her schedule for now. Will not pursue enrollment.  Margret Chance Leaf [employer], RN, BSN, [facility] She  Her  [facility] Clinical Research Nurse [facility] Direct Dial [phone]  Pager [phone] 07/12/2022 1:27 PM

## 2022-07-14 ENCOUNTER — Telehealth: Payer: Self-pay | Admitting: *Deleted

## 2022-07-14 ENCOUNTER — Encounter: Payer: Self-pay | Admitting: *Deleted

## 2022-07-14 NOTE — Telephone Encounter (Signed)
Left message for a return phone call to to follow up from [facility] 5/22.

## 2022-07-19 ENCOUNTER — Encounter: Payer: Self-pay | Admitting: General Practice

## 2022-07-19 ENCOUNTER — Encounter: Payer: Self-pay | Admitting: Genetic Counselor

## 2022-07-19 ENCOUNTER — Telehealth: Payer: Self-pay | Admitting: Genetic Counselor

## 2022-07-19 ENCOUNTER — Telehealth: Payer: Self-pay | Admitting: Hematology and Oncology

## 2022-07-19 DIAGNOSIS — Z1379 Encounter for other screening for genetic and chromosomal anomalies: Secondary | ICD-10-CM | POA: Insufficient documentation

## 2022-07-19 NOTE — Telephone Encounter (Signed)
Scheduled appointment per scheduling message. Left voicemail.  

## 2022-07-19 NOTE — Progress Notes (Signed)
[facility] Spiritual Care Note  Followed up with Ms [facility] by phone after meeting in [facility] to provide another opportunity for processing feelings about diagnosis and treatment plan. Ms Palas reports that she is doing well overall and feels very supported, including by her team at [facility]. She reports no other needs at this time, but knows to contact chaplain whenever needed/desired.   [address] [place], [place], [facility] Pager [phone] Voicemail [phone]

## 2022-07-19 NOTE — Telephone Encounter (Signed)
I attempted to contact Michele Swanson to discuss her genetic testing results (43 genes). I left a voicemail requesting she call me back at [phone].  Michele Brothers, MS, [facility] Genetic Counselor [email or web address] (P) [phone]

## 2022-07-21 ENCOUNTER — Telehealth: Payer: Self-pay | Admitting: Genetic Counselor

## 2022-07-21 NOTE — Telephone Encounter (Signed)
I contacted Ms. Desaulniers to discuss her genetic testing results. 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 The remaining 42 genes were Negative. Report date is 07/15/2022. Detailed clinic note to follow.  The test report has been scanned into EPIC and is located under the Molecular Pathology section of the Results Review tab.  A portion of the result report is included below for reference.   Lalla Brothers, MS, [facility] Genetic Counselor [email or web address] (P) [phone]

## 2022-07-25 ENCOUNTER — Encounter: Payer: Self-pay | Admitting: *Deleted

## 2022-07-27 ENCOUNTER — Encounter: Payer: Self-pay | Admitting: Genetic Counselor

## 2022-07-27 ENCOUNTER — Encounter (HOSPITAL_BASED_OUTPATIENT_CLINIC_OR_DEPARTMENT_OTHER): Payer: Self-pay | Admitting: Surgery

## 2022-07-27 ENCOUNTER — Ambulatory Visit: Payer: Self-pay | Admitting: Genetic Counselor

## 2022-07-27 DIAGNOSIS — Z1379 Encounter for other screening for genetic and chromosomal anomalies: Secondary | ICD-10-CM

## 2022-07-27 NOTE — Progress Notes (Signed)
HPI:   Michele Swanson was previously seen in the [facility] due to a personal and family history of cancer and concerns regarding a hereditary predisposition to cancer. Please refer to our prior cancer genetics clinic note for more information regarding our discussion, assessment and recommendations, at the time. Michele Swanson recent genetic test results were disclosed to her, as were recommendations warranted by these results. These results and recommendations are discussed in more detail below.  CANCER HISTORY:  Oncology History  Malignant neoplasm of upper-outer quadrant of left breast in female, estrogen receptor positive (HCC)  06/24/2022 Initial Diagnosis   Screening mammogram detected left breast asymmetry UOQ.  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 The remaining 42 genes were Negative. Report date is 07/15/2022.  The Custom Hereditary Cancers Panel offered by [facility] includes sequencing and/or deletion duplication testing of the following 43 genes: APC, ATM, AXIN2, BAP1, BARD1, BMPR1A, BRCA1, BRCA2, BRIP1, CDH1, CDK4, CDKN2A (p14ARF and p16INK4a only), CHEK2, CTNNA1, EPCAM (Deletion/duplication testing only), FH, GREM1 (promoter region duplication testing only), HOXB13, KIT, MBD4, MEN1, MLH1, MSH2, MSH3, MSH6, MUTYH, NF1, NHTL1, PALB2, PDGFRA, PMS2, POLD1, POLE, PTEN, RAD51C, RAD51D, SMAD4, SMARCA4. STK11, TP53, TSC1, TSC2, and VHL.     FAMILY HISTORY:  We obtained a  detailed, 4-generation family history.  Significant diagnoses are listed below:      Family History  Problem Relation Age of Onset   Dementia Mother     Heart disease Father     Atrial fibrillation Brother     Breast cancer Maternal Aunt 41 - 89   Colon cancer Maternal Uncle     Heart disease Paternal Grandmother     Ovarian cancer Cousin          possibly uterine, maternal first cousin       Michele Swanson had five maternal uncles and one uncle was diagnosed with colon cancer at an unknown age, he is deceased. She had five maternal aunts and one aunt was diagnosed with breast cancer in her late 41s, she is deceased. Michele Swanson maternal first cousin was diagnosed with ovarian cancer (possibly uterine cancer). Michele Swanson is unaware of previous family history of genetic testing for hereditary cancer risks. There is no reported Ashkenazi Jewish ancestry.   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  The Custom Hereditary Cancers Panel offered by [facility] includes sequencing and/or deletion duplication testing of the following 43 genes: APC, ATM, AXIN2, BAP1, BARD1, BMPR1A, BRCA1, BRCA2, BRIP1, CDH1, CDK4, CDKN2A (p14ARF and p16INK4a only), CHEK2, CTNNA1, EPCAM (Deletion/duplication testing only), FH, GREM1 (promoter region duplication testing only), HOXB13, KIT, MBD4, MEN1, MLH1, MSH2, MSH3, MSH6, MUTYH, NF1, NHTL1, PALB2, PDGFRA, PMS2, POLD1, POLE, PTEN, RAD51C, RAD51D, SMAD4, SMARCA4. STK11, TP53, TSC1, TSC2, and VHL.  The test report has been scanned into EPIC and is located under the Molecular Pathology section of the Results Review tab.  A portion of the result  report is included below for reference. Genetic testing reported out on 07/15/2022.       HOXB13 Gene: The c.251G>A (p.Gly84Glu) variant in the HOXB13 gene is associated with autosomal dominant predisposition to prostate cancer.    Cancer Risk Figures: The  lifetime risk of developing prostate cancer is 31-60%. Currently, there is no known increase in cancer risk for females.   Implications for Family Members: Hereditary predisposition to cancer due to pathogenic variants in the HOXB13 gene has autosomal dominant inheritance. This means that an individual with a pathogenic variant has a 50% chance of passing the condition on to his/her offspring. Identification of a pathogenic variant allows for the recognition of at-risk relatives who can pursue testing for the familial variant.   Family members are encouraged to consider genetic testing for this familial pathogenic variant. As there are no childhood cancer risks associated with pathogenic variants in the HOXB13 gene, individuals in the family are not recommended to have testing until they reach at least 69 years of age. They may contact our office at [phone] for more information or to schedule an appointment.  Complimentary testing for the familial variant is available for 150 days from her report date.  Family members who live outside of the area are encouraged to find a genetic counselor in their area by visiting: [email or web address].  ADDITIONAL INFORMATION: Even though a pathogenic variant was not identified that explains the family history of cancer, possible explanations for the cancer in the family may include:  The cancers in Michele Swanson and/or her family may be due to other genetic or environmental factors. There may be a gene mutation in one of these genes that current testing methods cannot detect, but that chance is small. There could be another gene that has not yet been discovered, or that we have not yet tested, that is responsible for the cancer diagnoses in the family.  It is also possible there is a hereditary cause for the cancer in the family that Michele Swanson did not inherit.  Therefore, it is important to remain in touch with cancer genetics in the future  so that we can continue to offer Michele Swanson the most up to date genetic testing.   ADDITIONAL GENETIC TESTING:  We discussed with [name] that her genetic testing was fairly extensive.  If there are genes identified to increase cancer risk that can be analyzed in the future, we would be happy to discuss and coordinate this testing at that time.    CANCER SCREENING RECOMMENDATIONS:  An individual's cancer risk and medical management are not determined by genetic test results alone. Overall cancer risk assessment incorporates additional factors, including personal medical history, family history, and any available genetic information that may result in a personalized plan for cancer prevention and surveillance. Therefore, it is recommended she continue to follow the cancer management and screening guidelines provided by her oncology and primary healthcare provider.  FOLLOW-UP:  Cancer genetics is a rapidly advancing field and it is possible that new genetic tests will be appropriate for her and/or her family members in the future. We encouraged her to remain in contact with cancer genetics on an annual basis so we can update her personal and family histories and let her know of advances in cancer genetics that may benefit this family.   Our contact number was provided. Ms. Koder questions were answered to her satisfaction, and she knows she is welcome to call us at anytime with additional questions or concerns.  Lucille Passy, MS, [facility] Genetic Counselor [email or web address] (P) [phone]

## 2022-08-01 NOTE — Anesthesia Preprocedure Evaluation (Signed)
Anesthesia Evaluation  Patient identified by MRN, date of birth, ID band Patient awake    Reviewed: Allergy & Precautions, NPO status , Patient's Chart, lab work & pertinent test results  Airway Mallampati: II  TM Distance: >3 FB Neck ROM: Full    Dental  (+) Edentulous Upper   Pulmonary Current Smoker Normally <1/2ppd No inhalers   Pulmonary exam normal breath sounds clear to auscultation       Cardiovascular hypertension (116/71 preop), Pt. on medications Normal cardiovascular exam Rhythm:Regular Rate:Normal     Neuro/Psych  PSYCHIATRIC DISORDERS Anxiety Depression    negative neurological ROS     GI/Hepatic negative GI ROS, Neg liver ROS,,,  Endo/Other  negative endocrine ROS    Renal/GU negative Renal ROS  negative genitourinary   Musculoskeletal negative musculoskeletal ROS (+)    Abdominal   Peds  Hematology negative hematology ROS (+)   Anesthesia Other Findings   Reproductive/Obstetrics negative OB ROS                             Anesthesia Physical Anesthesia Plan  ASA: 2  Anesthesia Plan: General   Post-op Pain Management: Tylenol PO (pre-op)*   Induction: Intravenous  PONV Risk Score and Plan: 2 and Ondansetron, Dexamethasone, Midazolam and Treatment may vary due to age or medical condition  Airway Management Planned: LMA  Additional Equipment: None  Intra-op Plan:   Post-operative Plan: Extubation in OR  Informed Consent: I have reviewed the patients History and Physical, chart, labs and discussed the procedure including the risks, benefits and alternatives for the proposed anesthesia with the patient or authorized representative who has indicated his/her understanding and acceptance.     Dental advisory given  Plan Discussed with: CRNA  Anesthesia Plan Comments:        Anesthesia Quick Evaluation

## 2022-08-02 ENCOUNTER — Encounter (HOSPITAL_BASED_OUTPATIENT_CLINIC_OR_DEPARTMENT_OTHER)
Admission: RE | Admit: 2022-08-02 | Discharge: 2022-08-02 | Disposition: A | Discharge status: Home / Self Care | Payer: Medicare PPO | Source: Ambulatory Visit | Attending: Surgery | Admitting: Surgery

## 2022-08-02 ENCOUNTER — Ambulatory Visit
Admission: RE | Admit: 2022-08-02 | Discharge: 2022-08-02 | Disposition: A | Discharge status: Home / Self Care | Payer: Medicare PPO | Source: Ambulatory Visit | Attending: Surgery | Admitting: Surgery

## 2022-08-02 DIAGNOSIS — Z170 Estrogen receptor positive status [ER+]: Secondary | ICD-10-CM | POA: Diagnosis not present

## 2022-08-02 DIAGNOSIS — F17210 Nicotine dependence, cigarettes, uncomplicated: Secondary | ICD-10-CM | POA: Insufficient documentation

## 2022-08-02 DIAGNOSIS — I10 Essential (primary) hypertension: Secondary | ICD-10-CM | POA: Diagnosis not present

## 2022-08-02 DIAGNOSIS — C50812 Malignant neoplasm of overlapping sites of left female breast: Secondary | ICD-10-CM | POA: Diagnosis not present

## 2022-08-02 DIAGNOSIS — F32A Depression, unspecified: Secondary | ICD-10-CM | POA: Insufficient documentation

## 2022-08-02 DIAGNOSIS — Z9181 History of falling: Secondary | ICD-10-CM | POA: Diagnosis not present

## 2022-08-02 DIAGNOSIS — F419 Anxiety disorder, unspecified: Secondary | ICD-10-CM | POA: Insufficient documentation

## 2022-08-02 DIAGNOSIS — C50912 Malignant neoplasm of unspecified site of left female breast: Secondary | ICD-10-CM

## 2022-08-02 DIAGNOSIS — R531 Weakness: Secondary | ICD-10-CM | POA: Diagnosis not present

## 2022-08-02 HISTORY — PX: BREAST BIOPSY: SHX20

## 2022-08-02 MED ORDER — CHLORHEXIDINE GLUCONATE CLOTH 2 % EX PADS: 6.0000 | MEDICATED_PAD | Freq: Once | CUTANEOUS | Status: DC

## 2022-08-02 NOTE — Progress Notes (Signed)

## 2022-08-03 ENCOUNTER — Ambulatory Visit (HOSPITAL_BASED_OUTPATIENT_CLINIC_OR_DEPARTMENT_OTHER): Payer: Medicare PPO | Admitting: Anesthesiology

## 2022-08-03 ENCOUNTER — Other Ambulatory Visit: Payer: Self-pay

## 2022-08-03 ENCOUNTER — Encounter (HOSPITAL_BASED_OUTPATIENT_CLINIC_OR_DEPARTMENT_OTHER): Payer: Self-pay | Admitting: Surgery

## 2022-08-03 ENCOUNTER — Ambulatory Visit (HOSPITAL_BASED_OUTPATIENT_CLINIC_OR_DEPARTMENT_OTHER)
Admission: RE | Admit: 2022-08-03 | Discharge: 2022-08-03 | Disposition: A | Discharge status: Home / Self Care | Payer: Medicare PPO | Attending: Surgery | Admitting: Surgery

## 2022-08-03 ENCOUNTER — Encounter (HOSPITAL_BASED_OUTPATIENT_CLINIC_OR_DEPARTMENT_OTHER)
Admission: RE | Disposition: A | Discharge status: Home / Self Care | Payer: Self-pay | Source: Home / Self Care | Attending: Surgery

## 2022-08-03 ENCOUNTER — Ambulatory Visit
Admission: RE | Admit: 2022-08-03 | Discharge: 2022-08-03 | Disposition: A | Discharge status: Home / Self Care | Payer: Medicare PPO | Source: Ambulatory Visit | Attending: Surgery | Admitting: Surgery

## 2022-08-03 DIAGNOSIS — I10 Essential (primary) hypertension: Secondary | ICD-10-CM | POA: Diagnosis not present

## 2022-08-03 DIAGNOSIS — C50512 Malignant neoplasm of lower-outer quadrant of left female breast: Secondary | ICD-10-CM

## 2022-08-03 DIAGNOSIS — Z170 Estrogen receptor positive status [ER+]: Secondary | ICD-10-CM | POA: Diagnosis not present

## 2022-08-03 DIAGNOSIS — C50812 Malignant neoplasm of overlapping sites of left female breast: Secondary | ICD-10-CM | POA: Diagnosis not present

## 2022-08-03 DIAGNOSIS — R531 Weakness: Secondary | ICD-10-CM | POA: Diagnosis not present

## 2022-08-03 DIAGNOSIS — F418 Other specified anxiety disorders: Secondary | ICD-10-CM | POA: Diagnosis not present

## 2022-08-03 DIAGNOSIS — F17210 Nicotine dependence, cigarettes, uncomplicated: Secondary | ICD-10-CM

## 2022-08-03 DIAGNOSIS — C50912 Malignant neoplasm of unspecified site of left female breast: Secondary | ICD-10-CM | POA: Diagnosis not present

## 2022-08-03 DIAGNOSIS — F419 Anxiety disorder, unspecified: Secondary | ICD-10-CM | POA: Diagnosis not present

## 2022-08-03 DIAGNOSIS — F32A Depression, unspecified: Secondary | ICD-10-CM | POA: Diagnosis not present

## 2022-08-03 DIAGNOSIS — D242 Benign neoplasm of left breast: Secondary | ICD-10-CM | POA: Diagnosis not present

## 2022-08-03 DIAGNOSIS — Z9181 History of falling: Secondary | ICD-10-CM | POA: Diagnosis not present

## 2022-08-03 DIAGNOSIS — C50412 Malignant neoplasm of upper-outer quadrant of left female breast: Secondary | ICD-10-CM | POA: Diagnosis not present

## 2022-08-03 HISTORY — PX: BREAST LUMPECTOMY WITH RADIOACTIVE SEED LOCALIZATION: SHX6424

## 2022-08-03 SURGERY — BREAST LUMPECTOMY WITH RADIOACTIVE SEED LOCALIZATION
Anesthesia: General | Site: Breast | Laterality: Left

## 2022-08-03 MED ORDER — DEXAMETHASONE SODIUM PHOSPHATE 4 MG/ML IJ SOLN
INTRAMUSCULAR | Status: DC | PRN
  Administered 2022-08-03: 4 mg via INTRAVENOUS

## 2022-08-03 MED ORDER — FENTANYL CITRATE (PF) 100 MCG/2ML IJ SOLN
INTRAMUSCULAR | Status: DC | PRN
  Administered 2022-08-03: 50 ug via INTRAVENOUS

## 2022-08-03 MED ORDER — FENTANYL CITRATE (PF) 100 MCG/2ML IJ SOLN
INTRAMUSCULAR | Status: AC
  Filled 2022-08-03: qty 2

## 2022-08-03 MED ORDER — OXYCODONE HCL 5 MG/5ML PO SOLN: 5.0000 mg | Freq: Once | ORAL | Status: DC | PRN

## 2022-08-03 MED ORDER — BUPIVACAINE-EPINEPHRINE (PF) 0.25% -1:200000 IJ SOLN
INTRAMUSCULAR | Status: DC | PRN
  Administered 2022-08-03: 20 mL

## 2022-08-03 MED ORDER — PROPOFOL 10 MG/ML IV BOLUS
INTRAVENOUS | Status: DC | PRN
  Administered 2022-08-03: 150 mg via INTRAVENOUS

## 2022-08-03 MED ORDER — LACTATED RINGERS IV SOLN: INTRAVENOUS | Status: DC

## 2022-08-03 MED ORDER — SODIUM CHLORIDE 0.9 % IV SOLN
INTRAVENOUS | Status: DC | PRN
  Administered 2022-08-03: 400 mL

## 2022-08-03 MED ORDER — PHENYLEPHRINE 80 MCG/ML (10ML) SYRINGE FOR IV PUSH (FOR BLOOD PRESSURE SUPPORT)
PREFILLED_SYRINGE | INTRAVENOUS | Status: AC
  Filled 2022-08-03: qty 10

## 2022-08-03 MED ORDER — MIDAZOLAM HCL 2 MG/2ML IJ SOLN
INTRAMUSCULAR | Status: AC
  Filled 2022-08-03: qty 2

## 2022-08-03 MED ORDER — PHENYLEPHRINE HCL (PRESSORS) 10 MG/ML IV SOLN
INTRAVENOUS | Status: DC | PRN
  Administered 2022-08-03: 80 ug via INTRAVENOUS

## 2022-08-03 MED ORDER — ONDANSETRON HCL 4 MG/2ML IJ SOLN
INTRAMUSCULAR | Status: AC
  Filled 2022-08-03: qty 2

## 2022-08-03 MED ORDER — HYDROMORPHONE HCL 1 MG/ML IJ SOLN
INTRAMUSCULAR | Status: AC
  Filled 2022-08-03: qty 0.5

## 2022-08-03 MED ORDER — OXYCODONE HCL 5 MG PO TABS: 5.0000 mg | ORAL_TABLET | Freq: Once | ORAL | Status: DC | PRN

## 2022-08-03 MED ORDER — CEFAZOLIN SODIUM-DEXTROSE 2-4 GM/100ML-% IV SOLN
INTRAVENOUS | Status: AC
  Filled 2022-08-03: qty 100

## 2022-08-03 MED ORDER — ACETAMINOPHEN 500 MG PO TABS
ORAL_TABLET | ORAL | Status: AC
  Filled 2022-08-03: qty 2

## 2022-08-03 MED ORDER — ONDANSETRON HCL 4 MG/2ML IJ SOLN
INTRAMUSCULAR | Status: DC | PRN
  Administered 2022-08-03: 4 mg via INTRAVENOUS

## 2022-08-03 MED ORDER — DEXAMETHASONE SODIUM PHOSPHATE 10 MG/ML IJ SOLN
INTRAMUSCULAR | Status: AC
  Filled 2022-08-03: qty 1

## 2022-08-03 MED ORDER — ACETAMINOPHEN 500 MG PO TABS
1000.0000 mg | ORAL_TABLET | Freq: Once | ORAL | Status: AC
  Administered 2022-08-03: 1000 mg via ORAL

## 2022-08-03 MED ORDER — ATROPINE SULFATE 0.4 MG/ML IV SOLN
INTRAVENOUS | Status: AC
  Filled 2022-08-03: qty 1

## 2022-08-03 MED ORDER — SODIUM CHLORIDE 0.9 % IV SOLN
INTRAVENOUS | Status: AC
  Filled 2022-08-03: qty 10

## 2022-08-03 MED ORDER — AMISULPRIDE (ANTIEMETIC) 5 MG/2ML IV SOLN: 10.0000 mg | Freq: Once | INTRAVENOUS | Status: DC | PRN

## 2022-08-03 MED ORDER — ONDANSETRON HCL 4 MG/2ML IJ SOLN: 4.0000 mg | Freq: Once | INTRAMUSCULAR | Status: DC | PRN

## 2022-08-03 MED ORDER — LIDOCAINE 2% (20 MG/ML) 5 ML SYRINGE
INTRAMUSCULAR | Status: AC
  Filled 2022-08-03: qty 5

## 2022-08-03 MED ORDER — HYDROMORPHONE HCL 1 MG/ML IJ SOLN
0.2500 mg | INTRAMUSCULAR | Status: DC | PRN
  Administered 2022-08-03: 0.25 mg via INTRAVENOUS

## 2022-08-03 MED ORDER — SUCCINYLCHOLINE CHLORIDE 200 MG/10ML IV SOSY
PREFILLED_SYRINGE | INTRAVENOUS | Status: AC
  Filled 2022-08-03: qty 10

## 2022-08-03 MED ORDER — CEFAZOLIN SODIUM-DEXTROSE 2-4 GM/100ML-% IV SOLN
2.0000 g | INTRAVENOUS | Status: AC
  Administered 2022-08-03: 2 g via INTRAVENOUS

## 2022-08-03 MED ORDER — EPHEDRINE 5 MG/ML INJ
INTRAVENOUS | Status: AC
  Filled 2022-08-03: qty 5

## 2022-08-03 MED ORDER — LIDOCAINE HCL (CARDIAC) PF 100 MG/5ML IV SOSY
PREFILLED_SYRINGE | INTRAVENOUS | Status: DC | PRN
  Administered 2022-08-03: 60 mg via INTRAVENOUS

## 2022-08-03 MED ORDER — OXYCODONE HCL 5 MG PO TABS
5.0000 mg | ORAL_TABLET | Freq: Four times a day (QID) | ORAL | 0 refills | Status: DC | PRN
Start: 2022-08-03 — End: 2022-12-29

## 2022-08-03 SURGICAL SUPPLY — 49 items
ADH SKN CLS APL DERMABOND .7 (GAUZE/BANDAGES/DRESSINGS) ×1
APL PRP STRL LF DISP 70% ISPRP (MISCELLANEOUS) ×1
APPLIER CLIP 9.375 MED OPEN (MISCELLANEOUS) ×1
APR CLP MED 9.3 20 MLT OPN (MISCELLANEOUS) ×1
BAG DECANTER FOR FLEXI CONT (MISCELLANEOUS) IMPLANT
BINDER BREAST LRG (GAUZE/BANDAGES/DRESSINGS) IMPLANT
BINDER BREAST XLRG (GAUZE/BANDAGES/DRESSINGS) IMPLANT
BLADE SURG 15 STRL LF DISP TIS (BLADE) ×1 IMPLANT
BLADE SURG 15 STRL SS (BLADE) ×1
CANISTER SUC SOCK COL 7IN (MISCELLANEOUS) IMPLANT
CANISTER SUCT 1200ML W/VALVE (MISCELLANEOUS) IMPLANT
CHLORAPREP W/TINT 26 (MISCELLANEOUS) ×1 IMPLANT
CLIP APPLIE 9.375 MED OPEN (MISCELLANEOUS) IMPLANT
COVER BACK TABLE 60X90IN (DRAPES) ×1 IMPLANT
COVER MAYO STAND STRL (DRAPES) ×1 IMPLANT
COVER PROBE CYLINDRICAL 5X96 (MISCELLANEOUS) ×1 IMPLANT
DERMABOND ADVANCED .7 DNX12 (GAUZE/BANDAGES/DRESSINGS) ×1 IMPLANT
DRAPE LAPAROTOMY 100X72 PEDS (DRAPES) ×1 IMPLANT
DRAPE UTILITY XL STRL (DRAPES) ×1 IMPLANT
ELECT COATED BLADE 2.86 ST (ELECTRODE) ×1 IMPLANT
ELECT REM PT RETURN 9FT ADLT (ELECTROSURGICAL) ×1
ELECTRODE REM PT RTRN 9FT ADLT (ELECTROSURGICAL) ×1 IMPLANT
GLOVE BIOGEL PI IND STRL 7.0 (GLOVE) IMPLANT
GLOVE BIOGEL PI IND STRL 8 (GLOVE) ×1 IMPLANT
GLOVE ECLIPSE 8.0 STRL XLNG CF (GLOVE) ×1 IMPLANT
GLOVE SURG SS PI 7.0 STRL IVOR (GLOVE) IMPLANT
GOWN STRL REUS W/ TWL LRG LVL3 (GOWN DISPOSABLE) ×2 IMPLANT
GOWN STRL REUS W/ TWL XL LVL3 (GOWN DISPOSABLE) ×1 IMPLANT
GOWN STRL REUS W/TWL LRG LVL3 (GOWN DISPOSABLE) ×2
GOWN STRL REUS W/TWL XL LVL3 (GOWN DISPOSABLE) ×1
HEMOSTAT ARISTA ABSORB 3G PWDR (HEMOSTASIS) IMPLANT
HEMOSTAT SNOW SURGICEL 2X4 (HEMOSTASIS) IMPLANT
KIT MARKER MARGIN INK (KITS) ×1 IMPLANT
NDL HYPO 25X1 1.5 SAFETY (NEEDLE) ×1 IMPLANT
NEEDLE HYPO 25X1 1.5 SAFETY (NEEDLE) ×1 IMPLANT
NS IRRIG 1000ML POUR BTL (IV SOLUTION) ×1 IMPLANT
PACK BASIN DAY SURGERY FS (CUSTOM PROCEDURE TRAY) ×1 IMPLANT
PENCIL SMOKE EVACUATOR (MISCELLANEOUS) ×1 IMPLANT
SLEEVE SCD COMPRESS KNEE MED (STOCKING) ×1 IMPLANT
SPIKE FLUID TRANSFER (MISCELLANEOUS) IMPLANT
SPONGE T-LAP 4X18 ~~LOC~~+RFID (SPONGE) ×1 IMPLANT
SUT MNCRL AB 4-0 PS2 18 (SUTURE) ×1 IMPLANT
SUT SILK 2 0 SH (SUTURE) IMPLANT
SUT VICRYL 3-0 CR8 SH (SUTURE) ×1 IMPLANT
SYR CONTROL 10ML LL (SYRINGE) ×1 IMPLANT
TOWEL GREEN STERILE FF (TOWEL DISPOSABLE) ×1 IMPLANT
TRAY FAXITRON CT DISP (TRAY / TRAY PROCEDURE) ×1 IMPLANT
TUBE CONNECTING 20X1/4 (TUBING) IMPLANT
YANKAUER SUCT BULB TIP NO VENT (SUCTIONS) IMPLANT

## 2022-08-03 NOTE — Transfer of Care (Signed)
Immediate Anesthesia Transfer of Care Note  Patient: Michele Swanson [facility]  Procedure(s) Performed: LEFT BREAST LUMPECTOMY WITH RADIOACTIVE SEED LOCALIZATION (Left: Breast)  Patient Location: PACU  Anesthesia Type:General  Level of Consciousness: awake, alert , oriented, drowsy, and patient cooperative  Airway & Oxygen Therapy: Patient Spontanous Breathing and Patient connected to face mask oxygen  Post-op Assessment: Report given to RN and Post -op Vital signs reviewed and stable  Post vital signs: Reviewed and stable  Last Vitals:  Vitals Value Taken Time  BP    Temp    Pulse 88 08/03/22 1018  Resp    SpO2 98 % 08/03/22 1018  Vitals shown include unvalidated device data.  Last Pain:  Vitals:   08/03/22 0811  TempSrc: Temporal  PainSc: 0-No pain         Complications: No notable events documented.

## 2022-08-03 NOTE — H&P (Signed)
History of Present Illness: Michele Swanson is a 67 y.o. female who is seen today as an office consultation for evaluation of Breast Cancer  Pleasant 67 year old female presents for evaluation of left breast cancer at the [facility] today. No history of breast pain, mass or discharge. Core biopsy showed low-grade ductal carcinoma measuring 5 mm. Is ER positive, PR positive, HER2/neu negative with AKI 67 remove 5%. She has issues with weakness and has seen a neurologist and neurosurgeon before. She is quite frail. She does have issues with falls at home and balance.  Review of Systems: A complete review of systems was obtained from the patient. I have reviewed this information and discussed as appropriate with the patient. See HPI as well for other ROS.    Medical History: Past Medical History:  Diagnosis Date  Anxiety  History of cancer  Hyperlipidemia  Hypertension   There is no problem list on file for this patient.  Past Surgical History:  Procedure Laterality Date  brest reduction  JOINT REPLACEMENT    Allergies  Allergen Reactions  Diclofenac Sodium Nausea  Hydrocodone-Acetaminophen Unknown  Itching all over  Zolpidem Tartrate Other (See Comments)  Syncope episode   Current Outpatient Medications on File Prior to Visit  Medication Sig Dispense Refill  ascorbic acid, vitamin C, (VITAMIN C) 100 MG tablet Take 100 mg by mouth once daily  cholecalciferol (VITAMIN D3) 2,000 unit capsule Take 2,000 Units by mouth once daily  coenzyme Q10-vitamin E 100-5 mg-unit Cap Take 1 tablet by mouth once daily  cyanocobalamin (VITAMIN B12) 1000 MCG tablet Take 1,000 mcg by mouth once daily  irbesartan (AVAPRO) 150 MG tablet Take 150 mg by mouth once daily  venlafaxine (EFFEXOR) 75 MG tablet take two tabs orally once a day   No current facility-administered medications on file prior to visit.   History reviewed. No pertinent family history.   Social History   Tobacco Use  Smoking Status  Every Day  Types: Cigarettes  Smokeless Tobacco Not on file    Social History   Socioeconomic History  Marital status: Married  Tobacco Use  Smoking status: Every Day  Types: Cigarettes  Substance and Sexual Activity  Alcohol use: Not Currently  Drug use: Not Currently   Objective:  There were no vitals filed for this visit.  There is no height or weight on file to calculate BMI.  Physical Exam Exam conducted with a chaperone present.  Cardiovascular:  Rate and Rhythm: Normal rate.  Pulmonary:  Effort: Pulmonary effort is normal.  Chest:  Chest wall: No mass.  Breasts: Right: Normal. No inverted nipple or mass.  Left: Normal. No inverted nipple or mass.  Musculoskeletal:  General: Normal range of motion.  Cervical back: Normal range of motion.  Lymphadenopathy:  Upper Body:  Right upper body: No supraclavicular or axillary adenopathy.  Left upper body: No supraclavicular or axillary adenopathy.  Neurological:  General: No focal deficit present.  Mental Status: She is alert.  Psychiatric:  Mood and Affect: Mood normal.     Labs, Imaging and Diagnostic Testing: CLINICAL DATA: Possible asymmetry in the upper-outer left breast on a recent screening mammogram without comparisons. Status post bilateral breast reduction in the remote past.  EXAM: DIGITAL DIAGNOSTIC UNILATERAL LEFT MAMMOGRAM WITH TOMOSYNTHESIS; ULTRASOUND LEFT BREAST LIMITED  TECHNIQUE: Left digital diagnostic mammography and breast tomosynthesis was performed.; Targeted ultrasound examination of the left breast was performed.  COMPARISON: Previous exam(s).  ACR Breast Density Category b: There are scattered areas of fibroglandular  density.  FINDINGS: There is fibroglandular tissue and focal distortion with calcifications in the upper-outer left breast in the area of recently suspected asymmetry. There is also a small,, circumscribed mass-like area within the fibroglandular tissue with an  adjacent coarse, oval calcification.  On physical exam, no mass is palpable in the upper outer left breast. Expected postreduction scars are demonstrated with no scar in the upper-outer left breast.  Targeted ultrasound is performed, showing a 5 x 5 x 3 mm irregular, hypoechoic and echogenic mass in the 2 o'clock position of the left breast, 7 cm from the nipple, with associated adjacent increased echogenicity with architectural distortion. This corresponds to the location of the focal distortion and calcifications seen mammographically. No internal blood flow was seen with power Doppler.  Also demonstrated is a 9 x 4 x 4 mm bilobed, hypoechoic, circumscribed mass in the 2 o'clock position of the left breast, 7 cm from the nipple within an area of dense glandular tissue. This corresponds to the oval mass-like area seen mammographically. Internal blood flow was seen with power Doppler.  Ultrasound of the left axilla demonstrated normal appearing left axillary lymph nodes.  IMPRESSION: 1. 5 mm mass with associated architectural distortion in the 2 o'clock position of the left breast. This is suspicious for malignancy. 2. 9 mm indeterminate mass in the 2 o'clock position of the left breast, 7 cm from the nipple, within an area of dense glandular tissue.  RECOMMENDATION: Ultrasound-guided core needle biopsies of the 9 mm and 5 mm masses in the 2 o'clock position of the left breast, 7 cm from the nipple. This has been discussed with the patient and the biopsies have been scheduled at 9:30 a.m. on 06/24/2022.  I have discussed the findings and recommendations with the patient. If applicable, a reminder letter will be sent to the patient regarding the next appointment.  BI-RADS CATEGORY 4: Suspicious.   Electronically Signed By: Beckie Salts M.D. On: 06/20/2022 12:02 Diagnosis 1. Breast, left, needle core biopsy, upper outer, 2 o'clock, 7cmfn (coil clip) INVASIVE  WELL-DIFFERENTIATED DUCTAL ADENOCARCINOMA, GRADE 1 (1+1+1) LOW-GRADE DUCTAL CARCINOMA IN SITU, CRIBRIFORM TYPE WITHOUT NECROSIS TUBULE FORMATION: SCORE 1 NUCLEAR PLEOMORPHISM: SCORE 1 MITOTIC COUNT: SCORE 1 TOTAL SCORE: 3 OVERALL GRADE GRADE 1 (3/9) NEGATIVE FOR ANGIOLYMPHATIC INVASION NEGATIVE FOR MICROCALCIFICATIONS TUMOR MEASURES 7.5 MM IN GREATEST LINEAR EXTENT 2. Breast, left, needle core biopsy, upper outer, 2 o'clock, 7cmfn (venus clip) BENIGN BREAST SHOWING FIBROMATOID CHANGE WITH ADENOSIS NEGATIVE FOR MICROCALCIFICATIONS NEGATIVE FOR ATYPIA AND CARCINOMA Diagnosis Note 1. -2. Immunohistochemical stains for the breast prognostic markers have been ordered and these results will be issued within a subsequent addendum to this report. Case is reviewed by Dr. Reynolds Bowl who concurs with the diagnosis. Diagnoses conveyed to [facility] at [facility] by Dr. Venetia Night on 06/27/2022 at 10:20 AM. Jerene Bears MD Pathologist, Electronic Signature (Case signed 06/27/2022) 1 of 3 FINAL for COURNTEY, DENATALE [phone]) Specimen Gross and Clinical Information Specimen Comment 1. TIF: 10:25 AM, CIT < 73 minute; 67 year old female with 0.5cm upper outer left breast mass 2. TIF: 10:30 AM, CIT < 1 minute; 0.9cm upper outer left breast mass Specimen(s) Obtained: 1. Breast, left, needle core biopsy, upper outer, 2 o'clock, 7cmfn (coil clip) 2. Breast, left, needle core biopsy, upper outer, 2 o'clock, 7cmfn (venus clip) Gross 1. Received in formalin labeled with the patient's name [facility]) and "LB 0.5 cm 2 o'clkk 7 cmfn" is a 1.5 x 1.3 x 0.2  cm aggregate of yellow-tan fibrofatty tissue, submitted in toto in a single cassette(s). TIF 10:25, CIT <1 minute (coil clip). (LEF, 06/24/2022) 2. Received in formalin labeled with the patient's name [facility]) and "LB 0.9 cm 2 o'clk 7 cmfn" is a 1.2 x 1.2 x 0.2 cm aggregate of yellow-tan fibrofatty tissue, submitted in toto in  a single cassette(s). TIF 10:30, CIT <1 minute (venus clip). (LEF, 06/24/2022) Stain(s) used in Diagnosis: The following stain(s) were used in diagnosing the case: ER-ACIS, PR-ACIS, Her2 by IHC, KI-67-ACIS. The control(s) stained appropriately. ADDITIONAL INFORMATION: 1. Breast, left, needle core biopsy, upper outer, 2 o'clock, cmfn (coil clip) PROGNOSTIC INDICATORS Results: IMMUNOHISTOCHEMICAL AND MORPHOMETRIC ANALYSIS PERFORMED MANUALLY The tumor cells are NEGATIVE for Her2 (1+). Estrogen Receptor: 100%, POSITIVE, STRONG STAINING INTENSITY Progesterone Receptor: 100%, POSITIVE, STRONG STAINING INTENSITY Proliferation Marker Ki67: 5% REFERENCE RANGE ESTROGEN RECEPTOR NEGATIVE 0% POSITIVE =>1% REFERENCE RANGE PROGESTERONE RECEPTOR NEGATIVE 0% POSITIVE =>1% All controls stained appropriately Jerene Bears MD Pathologist, Electronic Signature ( Signed 06/28/2022) Assessment and Plan:   Diagnoses and all orders for this visit:  Malignant neoplasm of upper-outer quadrant of left breast in female, estrogen receptor positive (CMS/HHS-HCC)   Discussed breast conserving surgery versus mastectomy with reconstruction. She is quite frail and we discussed the role of sentinel lymph node mapping given her small tumor size and low-grade. She requires extensive physical therapy looks like and has issues with balance and weakness. Recommending a neurology consult or follow-up since she is seeing neurology for these issues in the past. She may have the beginnings of Parkinson's disease I suspect. I do not recommend sentinel lymph node mapping in her case due to the small tumor size, low risk of disease in the axilla and a normal exam as well as ultrasound. Plan will be for lumpectomy with seed localization.The procedure has been discussed with the patient. Alternatives to surgery have been discussed with the patient. Risks of surgery include bleeding, Infection, Seroma formation, death, and the need  for further surgery. The patient understands and wishes to proceed.    Hayden Rasmussen, MD

## 2022-08-03 NOTE — Anesthesia Postprocedure Evaluation (Signed)
Anesthesia Post Note  Patient: Michele Swanson  Procedure(s) Performed: LEFT BREAST LUMPECTOMY WITH RADIOACTIVE SEED LOCALIZATION (Left: Breast)     Patient location during evaluation: PACU Anesthesia Type: General Level of consciousness: awake and alert, oriented and patient cooperative Pain management: pain level controlled Vital Signs Assessment: post-procedure vital signs reviewed and stable Respiratory status: spontaneous breathing, nonlabored ventilation and respiratory function stable Cardiovascular status: blood pressure returned to baseline and stable Postop Assessment: no apparent nausea or vomiting Anesthetic complications: no   No notable events documented.  Last Vitals:  Vitals:   08/03/22 1100 08/03/22 1119  BP: 111/74 (!) 114/95  Pulse: 81 80  Resp: 18 19  Temp:  (!) 36.2 C  SpO2: 93% 94%    Last Pain:  Vitals:   08/03/22 1119  TempSrc: Temporal  PainSc: 0-No pain                 Lannie Fields

## 2022-08-03 NOTE — Op Note (Signed)
Preoperative diagnosis: Stage I left breast cancer lower outer quadrant  Postoperative diagnosis: Same  Procedure: Left breast seed localized lumpectomy  Surgeon: Harriette Bouillon, MD  Anesthesia: General with 0.25% Marcaine plain  EBL: Minimal  Specimen: Left breast tissue with seed and 2 clips verified by radiology and intraoperative imaging with grossly negative margins  Drains: None  IV fluids: Per anesthesia record  Indications for procedure: The patient is a 67 year old female with stage I left breast cancer.  She presents for breast conserving surgery after being seen in the multidisciplinary clinic and reviewing all of her surgical and medical options.The procedure has been discussed with the patient. Alternatives to surgery have been discussed with the patient.  Risks of surgery include bleeding,  Infection,  Seroma formation, death,  and the need for further surgery.   The patient understands and wishes to proceed.    Description of procedure: The patient was met in the holding area questions were answered.  Left breast was marked as correct site.  Films were available for review.  She was taken back to the operating room.  She was placed supine upon the operating room table.  After induction of general anesthesia, left breast was prepped and draped in a sterile fashion and timeout performed.  Proper patient, site and procedure were verified.  The neoprobe was used to identify the seed in the left breast lower outer quadrant.  A curvilinear incision was made along the left lower outer breast.  Dissection was carried down all tissue and the seed and clip were excised with  a grossly negative margin.  Hemostasis was achieved with cautery.  Clips were placed.  Irrigation used and local anesthetic infiltrated.  The deep tissue layers were closed with 3-0 Vicryl.  4 Monocryl was used to close the skin in a subcuticular fashion.  Dermabond was applied.  All counts were found correct.  The  patient was awoke extubated taken to recovery in satisfactory condition.

## 2022-08-03 NOTE — Interval H&P Note (Signed)
History and Physical Interval Note:  08/03/2022 9:12 AM  Michele Swanson  has presented today for surgery, with the diagnosis of LEFT BREAST CANCER.  The various methods of treatment have been discussed with the patient and family. After consideration of risks, benefits and other options for treatment, the patient has consented to  Procedure(s): LEFT BREAST LUMPECTOMY WITH RADIOACTIVE SEED LOCALIZATION (Left) as a surgical intervention.  The patient's history has been reviewed, patient examined, no change in status, stable for surgery.  I have reviewed the patient's chart and labs.  Questions were answered to the patient's satisfaction.     [name] A Vetra Shinall

## 2022-08-03 NOTE — Discharge Instructions (Addendum)
[facility] Office Phone Number [phone]  BREAST BIOPSY/ PARTIAL MASTECTOMY: POST OP INSTRUCTIONS  Always review your discharge instruction sheet given to you by the facility where your surgery was performed.  IF YOU HAVE DISABILITY OR FAMILY LEAVE FORMS, YOU MUST BRING THEM TO THE OFFICE FOR PROCESSING.  DO NOT GIVE THEM TO YOUR DOCTOR.  A prescription for pain medication may be given to you upon discharge.  Take your pain medication as prescribed, if needed.  If narcotic pain medicine is not needed, then you may take acetaminophen (Tylenol) or ibuprofen (Advil) as needed. Take your usually prescribed medications unless otherwise directed If you need a refill on your pain medication, please contact your pharmacy.  They will contact our office to request authorization.  Prescriptions will not be filled after 5pm or on week-ends. You should eat very light the first 24 hours after surgery, such as soup, crackers, pudding, etc.  Resume your normal diet the day after surgery. Most patients will experience some swelling and bruising in the breast.  Ice packs and a good support bra will help.  Swelling and bruising can take several days to resolve.  It is common to experience some constipation if taking pain medication after surgery.  Increasing fluid intake and taking a stool softener will usually help or prevent this problem from occurring.  A mild laxative (Milk of Magnesia or Miralax) should be taken according to package directions if there are no bowel movements after 48 hours. Unless discharge instructions indicate otherwise, you may remove your bandages 24-48 hours after surgery, and you may shower at that time.  You may have steri-strips (small skin tapes) in place directly over the incision.  These strips should be left on the skin for 7-10 days.  If your surgeon used skin glue on the incision, you may shower in 24 hours.  The glue will flake off over the next 2-3 weeks.  Any  sutures or staples will be removed at the office during your follow-up visit. ACTIVITIES:  You may resume regular daily activities (gradually increasing) beginning the next day.  Wearing a good support bra or sports bra minimizes pain and swelling.  You may have sexual intercourse when it is comfortable. You may drive when you no longer are taking prescription pain medication, you can comfortably wear a seatbelt, and you can safely maneuver your car and apply brakes. RETURN TO WORK:  ______________________________________________________________________________________ Michele Swanson should see your doctor in the office for a follow-up appointment approximately two weeks after your surgery.  Your doctor's nurse will typically make your follow-up appointment when she calls you with your pathology report.  Expect your pathology report 2-3 business days after your surgery.  You may call to check if you do not hear from [place] after three days. OTHER INSTRUCTIONS: _______________________________________________________________________________________________ _____________________________________________________________________________________________________________________________________ _____________________________________________________________________________________________________________________________________ _____________________________________________________________________________________________________________________________________  WHEN TO CALL YOUR DOCTOR: Fever over 101.0 Nausea and/or vomiting. Extreme swelling or bruising. Continued bleeding from incision. Increased pain, redness, or drainage from the incision.  The clinic staff is available to answer your questions during regular business hours.  Please don't hesitate to call and ask to speak to one of the nurses for clinical concerns.  If you have a medical emergency, go to the nearest emergency room or call 911.  A surgeon from [facility] is always on call at the hospital.  For further questions, please visit [email or web address]     You may have Tylenol again after 2pm today, if needed   Post Anesthesia  Home Care Instructions  Activity: Get plenty of rest for the remainder of the day. A responsible individual must stay with you for 24 hours following the procedure.  For the next 24 hours, DO NOT: -Drive a car -Paediatric nurse -Drink alcoholic beverages -Take any medication unless instructed by your physician -Make any legal decisions or sign important papers.  Meals: Start with liquid foods such as gelatin or soup. Progress to regular foods as tolerated. Avoid greasy, spicy, heavy foods. If nausea and/or vomiting occur, drink only clear liquids until the nausea and/or vomiting subsides. Call your physician if vomiting continues.  Special Instructions/Symptoms: Your throat may feel dry or sore from the anesthesia or the breathing tube placed in your throat during surgery. If this causes discomfort, gargle with warm salt water. The discomfort should disappear within 24 hours.  If you had a scopolamine patch placed behind your ear for the management of post- operative nausea and/or vomiting:  1. The medication in the patch is effective for 72 hours, after which it should be removed.  Wrap patch in a tissue and discard in the trash. Wash hands thoroughly with soap and water. 2. You may remove the patch earlier than 72 hours if you experience unpleasant side effects which may include dry mouth, dizziness or visual disturbances. 3. Avoid touching the patch. Wash your hands with soap and water after contact with the patch.

## 2022-08-03 NOTE — Anesthesia Procedure Notes (Signed)
Procedure Name: LMA Insertion Date/Time: 08/03/2022 9:33 AM  Performed by: Ronnette Hila, CRNAPre-anesthesia Checklist: Patient identified, Emergency Drugs available, Suction available and Patient being monitored Patient Re-evaluated:Patient Re-evaluated prior to induction Oxygen Delivery Method: Circle system utilized Preoxygenation: Pre-oxygenation with 100% oxygen Induction Type: IV induction Ventilation: Mask ventilation without difficulty LMA: LMA inserted LMA Size: 3.0 Number of attempts: 1 Airway Equipment and Method: Bite block Placement Confirmation: positive ETCO2 Tube secured with: Tape Dental Injury: Teeth and Oropharynx as per pre-operative assessment

## 2022-08-04 ENCOUNTER — Ambulatory Visit: Payer: Medicare PPO | Admitting: Rehabilitative and Restorative Service Providers"

## 2022-08-04 ENCOUNTER — Encounter (HOSPITAL_BASED_OUTPATIENT_CLINIC_OR_DEPARTMENT_OTHER): Payer: Self-pay | Admitting: Surgery

## 2022-08-04 LAB — SURGICAL PATHOLOGY

## 2022-08-05 DIAGNOSIS — C50912 Malignant neoplasm of unspecified site of left female breast: Secondary | ICD-10-CM | POA: Diagnosis not present

## 2022-08-09 ENCOUNTER — Encounter: Payer: Medicare PPO | Admitting: Rehabilitative and Restorative Service Providers"

## 2022-08-10 ENCOUNTER — Encounter: Payer: Medicare PPO | Admitting: Rehabilitative and Restorative Service Providers"

## 2022-08-10 ENCOUNTER — Encounter: Payer: Self-pay | Admitting: *Deleted

## 2022-08-13 NOTE — Progress Notes (Signed)
Patient Care Team: [name], MD as PCP - General (Family Medicine) Tat, Octaviano Batty, DO as Consulting Physician (Neurology) Pershing Proud, RN as Oncology Nurse Navigator Donnelly Angelica, RN as Oncology Nurse Navigator [name], MD as Consulting Physician (Hematology and Oncology) Harriette Bouillon, MD as Consulting Physician (General Surgery) Antony Blackbird, MD as Consulting Physician (Radiation Oncology)  DIAGNOSIS: No diagnosis found.  SUMMARY OF ONCOLOGIC HISTORY: Oncology History  Malignant neoplasm of upper-outer quadrant of left breast in female, estrogen receptor positive (HCC)  06/24/2022 Initial Diagnosis   Screening mammogram detected left breast asymmetry UOQ.  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 The remaining 42 genes were Negative. Report date is 07/15/2022.  The Custom Hereditary Cancers Panel offered by [facility] includes sequencing and/or deletion duplication testing of the following 43 genes: APC, ATM, AXIN2, BAP1, BARD1, BMPR1A, BRCA1, BRCA2, BRIP1, CDH1, CDK4, CDKN2A (p14ARF and p16INK4a only), CHEK2, CTNNA1, EPCAM (Deletion/duplication testing only), FH, GREM1 (promoter region duplication testing only), HOXB13, KIT, MBD4, MEN1, MLH1, MSH2, MSH3, MSH6, MUTYH, NF1, NHTL1, PALB2, PDGFRA, PMS2, POLD1, POLE, PTEN, RAD51C, RAD51D, SMAD4, SMARCA4. STK11, TP53, TSC1, TSC2, and VHL.     CHIEF COMPLIANT: Follow-up after surgery  INTERVAL HISTORY: Michele Swanson  is a 67 y.o. female is here because of recent diagnosis of left breast cancer.  She presents to the clinic for a follow-up.    ALLERGIES:  is allergic to [employer] tartrate], hydrocodone-acetaminophen, and voltaren [diclofenac sodium].  MEDICATIONS:  Current Outpatient Medications  Medication Sig Dispense Refill   amLODipine (NORVASC) 5 MG tablet Take 5 mg by mouth daily.     Ascorbic Acid (VITAMIN C) 100 MG tablet Take 100 mg by mouth daily.     Cholecalciferol (VITAMIN D3) 50 MCG (2000 UT) capsule Take 2,000 Units by mouth daily.     Eszopiclone (ESZOPICLONE) 3 MG TABS Take 3 mg by mouth at bedtime. Take immediately before bedtime     hydrochlorothiazide (HYDRODIURIL) 12.5 MG tablet Take 12.5 mg by mouth daily.     irbesartan (AVAPRO) 150 MG tablet Take 150 mg by mouth daily.     MITIGARE 0.6 MG CAPS Take by mouth as needed. Take as directed     oxyCODONE (OXY IR/ROXICODONE) 5 MG immediate release tablet Take 1 tablet (5 mg total) by mouth every 6 (six) hours as needed for severe pain. 15 tablet 0   Venlafaxine HCl 75 MG TB24 Take 75 mg by mouth in the morning and at bedtime.      vitamin B-12 (CYANOCOBALAMIN) 1000 MCG tablet Take 1,000 mcg by mouth daily.     No current facility-administered medications for this visit.    PHYSICAL EXAMINATION: ECOG PERFORMANCE STATUS: {CHL ONC ECOG PS:[id number]}  There were no vitals filed for this visit. There were no vitals filed for this visit.  BREAST:*** No palpable masses or nodules in either right or left breasts. No palpable axillary supraclavicular or infraclavicular adenopathy no breast tenderness or nipple discharge. (exam performed  in the presence of a chaperone)  LABORATORY DATA:  I have reviewed the data as listed    Latest Ref Rng & Units 07/06/2022   11:30 AM 06/24/2019    1:34 PM 01/25/2019    9:25 AM  CMP  Glucose 70 - 99 mg/dL 914   782   BUN 8 - 23 mg/dL 19   21   Creatinine 9.56 - 1.00 mg/dL 2.13   0.86   Sodium  578 - 145 mmol/L 140   142   Potassium 3.5 - 5.1 mmol/L 4.7   4.5   Chloride 98 - 111 mmol/L 104   103   CO2 22 - 32 mmol/L 29   23   Calcium 8.9 - 10.3 mg/dL 9.7   9.5   Total Protein 6.5 - 8.1 g/dL 6.8  7.0  6.2   Total Bilirubin 0.3 - 1.2 mg/dL 0.6   0.2   Alkaline Phos 38 - 126 U/L 104   118   AST 15 - 41 U/L 17   12   ALT 0 - 44 U/L 16   10     Lab Results  Component Value Date   WBC 6.9 07/06/2022   HGB 14.0 07/06/2022   HCT 41.2 07/06/2022   MCV 108.4 (H) 07/06/2022   PLT 328 07/06/2022   NEUTROABS 5.1 07/06/2022    ASSESSMENT & PLAN:  No problem-specific Assessment & Plan notes found for this encounter.    No orders of the defined types were placed in this encounter.  The patient has a good understanding of the overall plan. she agrees with it. she will call with any problems that may develop before the next visit here. Total time spent: 30 mins including face to face time and time spent for planning, charting and co-ordination of care   Sherlyn Lick, CMA 08/13/22    I Janan Ridge am acting as a Neurosurgeon for [employer]  ***

## 2022-08-15 ENCOUNTER — Encounter: Payer: Medicare PPO | Admitting: Rehabilitative and Restorative Service Providers"

## 2022-08-15 ENCOUNTER — Encounter: Payer: Self-pay | Admitting: Surgery

## 2022-08-16 ENCOUNTER — Inpatient Hospital Stay: Payer: Medicare PPO | Attending: Hematology and Oncology | Admitting: Hematology and Oncology

## 2022-08-16 VITALS — BP 132/76 | HR 104 | Temp 97.7°F | Resp 18 | Ht 65.0 in | Wt 124.2 lb

## 2022-08-16 DIAGNOSIS — C50412 Malignant neoplasm of upper-outer quadrant of left female breast: Secondary | ICD-10-CM | POA: Insufficient documentation

## 2022-08-16 DIAGNOSIS — Z170 Estrogen receptor positive status [ER+]: Secondary | ICD-10-CM | POA: Diagnosis not present

## 2022-08-16 NOTE — Assessment & Plan Note (Signed)
06/24/2022:Screening mammogram detected left breast asymmetry UOQ.  Two abnormalities, 2 o'clock position 5 mm biopsy: Grade 1 IDC with DCIS ER 100%, PR 100%, HER2 negative 1+, Ki-67 5%; additional area 9 mm biopsy: Benign, axilla negative   08/03/2022: Left lumpectomy: Grade 1 IDC 0.8 cm, low-grade DCIS, LVI not present, margins negative, ER 100%, PR 100%, HER2 1+ negative, Ki-67 5%  Pathology counseling: I discussed the final pathology report of the patient provided  a copy of this report. I discussed the margins as well as lymph node surgeries. We also discussed the final staging along with previously performed ER/PR and HER-2/neu testing.  Treatment plan: I did not recommend Oncotype DX testing for less than 1 cm grade 1 tumor. Adjuvant radiation followed by Adjuvant antiestrogen therapy with anastrozole 1 mg daily x 5 years  Return to clinic after radiation is complete

## 2022-08-17 ENCOUNTER — Encounter: Payer: Medicare PPO | Admitting: Rehabilitative and Restorative Service Providers"

## 2022-08-19 NOTE — Progress Notes (Signed)
Location of Breast Cancer: Malignant neoplasm of upper-outer quadrant of left breast in female, estrogen receptor positive   Histology per Pathology Report:  08-03-22 FINAL MICROSCOPIC DIAGNOSIS:  A. BREAST, LEFT, LUMPECTOMY: Invasive ductal carcinoma; morphologically consistent with a tubular carcinoma (coil clip), 0.8 cm, Grade I/III Ductal carcinoma in situ: Cribriform type, nuclear grade 1 of 3 Margins, invasive: Negative but close     Closest, invasive: <1 mm (anterior); all others greater than 10 mm Margins, DCIS: Negative     Closest, DCIS: 2.5 mm (anterior Lymphovascular invasion: Not identified Prognostic markers:  ER positive, PR positive, Her2 negative, Ki-67 5% Other: Fibroadenoma associated with Venus clip See oncology table and gross description for further information  ONCOLOGY TABLE:  INVASIVE CARCINOMA OF THE BREAST:  Resection  Procedure: Lumpectomy Specimen Laterality: Left Histologic Type: Invasive ductal carcinoma (NOS)/invasive mammary carcinoma, NST overall consistent with a tubular carcinoma (associated with coil clip) Histologic Grade:      Glandular (Acinar)/Tubular Differentiation: 1/3      Nuclear Pleomorphism: 1/3      Mitotic Rate: 1/3      Overall Grade: I/III Tumor Size: Microscopically 8 mm Ductal Carcinoma In Situ: Focal ductal carcinoma with cribriform architecture, nuclear grade 1 of 3 Lymphatic and/or Vascular Invasion: Not identified Treatment Effect in the Breast: No known presurgical therapy Margins: All margins negative for invasive carcinoma      Distance from Closest Margin (mm): <1 mm      Specify Closest Margin (required only if <29mm): Anterior (all others greater than 10 mm DCIS Margins: Uninvolved by DCIS      Distance from Closest Margin (mm): 2.5      Specify Closest Margin (required only if <85mm): Anterior Regional Lymph Nodes: N/A; no lymph nodes submitted or identified      Number of Lymph Nodes Examined: N/A       Number of Sentinel Nodes Examined: N/A      Number of Lymph Nodes with Macrometastases (>2 mm): N/A      Number of Lymph Nodes with Micrometastases: N/A      Number of Lymph Nodes with Isolated Tumor Cells (=0.2 mm or =200 cells): N/A      Size of Largest Metastatic Deposit (mm): N/A      Extranodal Extension: N/A Distant Metastasis:      Distant Site(s) Involved: N/A Breast Biomarker Testing Performed on Previous Biopsy:      Testing Performed on Case Number: [id number]            Estrogen Receptor: Positive, 100% strong            Progesterone Receptor: Positive, 100% strong            HER2: Negative, 1+            Ki-67: 5% Pathologic Stage Classification (pTNM, AJCC 8th Edition): pT1b, pNX Representative Tumor Block: A7 Comment(s): Fibroadenoma (associated with Venus clip) ([id number])  Receptor Status: ER(100%), PR (100%), Her2-neu (neg), Ki-67(5%) Cornett, Maisie Fus, MD 08/03/2022   Did patient present with symptoms (if so, please note symptoms) or was this found on screening mammography?: screening mammogram  Past/Anticipated interventions by surgeon, if any:  Procedure: Left breast seed localized lumpectomy   Surgeon: Harriette Bouillon, MD  Past/Anticipated interventions by medical oncology, if any:  [name], MD 07/06/2022  Oncology History  Malignant neoplasm of upper-outer quadrant of left breast in female, estrogen receptor positive (HCC)  06/24/2022 Initial Diagnosis    Screening mammogram detected left breast asymmetry UOQ.  Two abnormalities, 2 o'clock position 5 mm biopsy: Grade 1 IDC with DCIS ER 100%, PR 100%, HER2 negative 1+, Ki-67 5%; additional area 9 mm biopsy: Benign, axilla negative    07/06/2022 Cancer Staging    Staging form: Breast, AJCC 8th Edition - Clinical stage from 07/06/2022: Stage IA (cT1a, cN0, cM0, G1, ER+, PR+, HER2-) - Signed by [name], MD on 07/06/2022 Stage prefix: Initial diagnosis Histologic grading system: 3 grade system   ASSESSMENT AND PLAN:  Malignant neoplasm of upper-outer quadrant of left breast in female, estrogen receptor positive (HCC) 06/24/2022:Screening mammogram detected left breast asymmetry UOQ.  Two abnormalities, 2 o'clock position 5 mm biopsy: Grade 1 IDC with DCIS ER 100%, PR 100%, HER2 negative 1+, Ki-67 5%; additional area 9 mm biopsy: Benign, axilla negative   Pathology and radiology counseling:Discussed with the patient, the details of pathology including the type of breast cancer,the clinical staging, the significance of ER, PR and HER-2/neu receptors and the implications for treatment. After reviewing the pathology in detail, we proceeded to discuss the different treatment options between surgery, radiation, antiestrogen therapies.   Recommendations: 1. Breast conserving surgery followed by 2. Adjuvant radiation therapy followed by 3. Adjuvant antiestrogen therapy with anastrozole 1 mg daily x 5 years    Return to clinic after surgery to discuss final pathology report and then finalize adjuvant treatment plan.  Lymphedema issues, if any:  none to report   Pain issues, if any:  burns and throbs at times, nothing of major concern  SAFETY ISSUES: Prior radiation? no Pacemaker/ICD? no Possible current pregnancy?no Is the patient on methotrexate? no  Current Complaints / other details:  Wants to know overall and what to expect. She has no major concerns at this time.

## 2022-08-22 ENCOUNTER — Encounter: Payer: Medicare PPO | Admitting: Rehabilitative and Restorative Service Providers"

## 2022-08-24 ENCOUNTER — Encounter: Payer: Medicare PPO | Admitting: Rehabilitative and Restorative Service Providers"

## 2022-08-29 ENCOUNTER — Encounter: Payer: Medicare PPO | Admitting: Rehabilitative and Restorative Service Providers"

## 2022-08-31 ENCOUNTER — Encounter: Payer: Self-pay | Admitting: Radiation Oncology

## 2022-08-31 ENCOUNTER — Telehealth: Payer: Self-pay

## 2022-08-31 ENCOUNTER — Encounter: Payer: Medicare PPO | Admitting: Rehabilitative and Restorative Service Providers"

## 2022-08-31 NOTE — Telephone Encounter (Signed)
Rn called pt for meaningful use and nurse evaluation information. She is doing well overall with no major concerns. Consult note completed and routed to Dr. Roselind Messier.

## 2022-08-31 NOTE — Progress Notes (Signed)
Radiation Oncology         [phone] ________________________________  Name: Michele Swanson MRN: [id number]  Date: 09/01/2022  DOB: [birth year]  Re-Evaluation Note  CC: [name], MD  [name], MD  [phone]   ICD-10-CM   1. Malignant neoplasm of upper-outer quadrant of left breast in female, estrogen receptor positive (HCC) [C50.412, Z17.0]  C50.412    Z17.0       Diagnosis: Stage IA (cT1a, cN0, cM0) Left Breast UOQ, Invasive ductal/tubular carcinoma with low grade DCIS, ER+ / PR+ / Her2-, Grade 1: s/p lumpectomy   Narrative:  The patient returns today to discuss radiation treatment options. She was seen in the multidisciplinary breast clinic on 07/06/22.   She underwent genetic testing on her consultation date. 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 This variant in the HOXB13 gene is associated with autosomal dominant predisposition to prostate cancer. With that being said, there is no known increase in cancer risk for females related to this variant.   She opted to proceed with a left breast lumpectomy without nodal biopsies on 08/03/22 under the care of Dr. Luisa Hart. Pathology from the procedure revealed: tumor the size of 8 mm; histology of grade 1 invasive ductal carcinoma - morphologically consistent with a tubular carcinoma, along with low grade DCIS; all margins negative for invasive and in situ carcinoma; margin status to invasive carcinoma of <1 mm from the anterior margin (all other margins are greater than 10 mm); margin status to in situ disease of 2.5 mm form the anterior margin. Prognostic indicators significant for: estrogen receptor 100% positive and progesterone receptor 100% positive, both with strong staining intensity; Proliferation marker Ki67 at 5%; Her2 status negative; Grade 1.      She recently followed up with Dr. Pamelia Hoit on 08/16/22 and has opted to proceed with antiestrogen  therapy consisting of anastrozole x 5 years. She will return to Dr. Pamelia Hoit after radiation is complete (prior to starting anastrozole).   On review of systems, the patient reports to be doing well overall. She has some residual nerve pain around her lumpectomy site. She denies any other breast specific complaints. Notably, she does have a history of shoulder issues unrelated to her breast cancer. These do interfere with her range of motion. However, she thinks she will be able to tolerate the standard position for breast radiation.   Allergies:  is allergic to [employer] tartrate], diclofenac, diclofenac sodium, hydrochlorothiazide w-triamterene, hydrocodone-acetaminophen, voltaren [diclofenac sodium], and zolpidem.  Meds: Current Outpatient Medications  Medication Sig Dispense Refill   amLODipine (NORVASC) 5 MG tablet Take 5 mg by mouth daily.     Ascorbic Acid (VITAMIN C) 100 MG tablet Take 100 mg by mouth daily.     Cholecalciferol (VITAMIN D3) 50 MCG (2000 UT) capsule Take 2,000 Units by mouth daily.     Eszopiclone (ESZOPICLONE) 3 MG TABS Take 3 mg by mouth at bedtime. Take immediately before bedtime     hydrochlorothiazide (HYDRODIURIL) 12.5 MG tablet Take 12.5 mg by mouth daily.     irbesartan (AVAPRO) 150 MG tablet Take 150 mg by mouth daily.     MITIGARE 0.6 MG CAPS Take by mouth as needed. Take as directed     Venlafaxine HCl 75 MG TB24 Take 75 mg by mouth in the morning and at bedtime.      vitamin B-12 (CYANOCOBALAMIN) 1000 MCG tablet Take 1,000 mcg by mouth daily.  oxyCODONE (OXY IR/ROXICODONE) 5 MG immediate release tablet Take 1 tablet (5 mg total) by mouth every 6 (six) hours as needed for severe pain. (Patient not taking: Reported on 08/31/2022) 15 tablet 0   No current facility-administered medications for this encounter.    Physical Findings: The patient is in no acute distress. Patient is alert and oriented.  height is 5\' 5"  (1.651 m) and weight is 124 lb 3.2 oz  (56.3 kg). Her temperature is 97.2 F (36.2 C) (abnormal). Her blood pressure is 107/62 and her pulse is 100. Her respiration is 20 and oxygen saturation is 97%.   Lungs are clear to auscultation bilaterally. Heart has regular rate and rhythm. No palpable cervical, supraclavicular, or axillary adenopathy. Abdomen soft, non-tender, normal bowel sounds. Right Breast: no palpable mass, nipple discharge or bleeding. Left Breast: Well healed lumpectomy scar. Skin edges are well approximated. No signs of infection. Scar tissue felt in the UOQ of the breast.  Lab Findings: Lab Results  Component Value Date   WBC 6.9 07/06/2022   HGB 14.0 07/06/2022   HCT 41.2 07/06/2022   MCV 108.4 (H) 07/06/2022   PLT 328 07/06/2022    Radiographic Findings: MM Breast Surgical Specimen  Result Date: 08/03/2022 CLINICAL DATA:  Status post seed localized lumpectomy of the LEFT breast. EXAM: SPECIMEN RADIOGRAPH OF THE LEFT she BREAST COMPARISON:  Previous exam(s). FINDINGS: Status post excision of the left breast. The radioactive seed, coil shaped clip, and Venus shaped are present, completely intact, and were marked for pathology. Findings are discussed with the operating room nurse at the time of interpretation. IMPRESSION: Specimen radiograph of the left breast. Electronically Signed   By: Norva Pavlov M.D.   On: 08/03/2022 09:57   Impression: Stage IA (cT1a, cN0, cM0) Left Breast UOQ, Invasive ductal/tubular carcinoma with low grade DCIS, ER+ / PR+ / Her2-, Grade 1: s/p lumpectomy   It was a pleasure seeing this patient again today. She is continuing to heal from her lumpectomy. Final pathology showed margin status to in situ disease of 2.5 mm, 1 mm for invasive, from the anterior margin. She will receive a slightly higher boost to account for this.   Today we discussed the natural course of early stage breast cancer. We highlighted the role of radiotherapy in the management and focused on the details of  logistics and delivery. We reviewed the anticipated acute and late sequelae associated with radiation in this setting. The patient was encouraged to ask questions that I answered to the best of my ability. Patient expressed readiness to proceed with treatment. A consent form was reviewed and signed today.    Plan:  Patient is scheduled for CT simulation later today. Anticipate 40.05 Gy in 15 fractions to the left breast. This will be followed by a boost of 12 Gy in 6 fractions to the lumpectomy cavity.   -----------------------------------   Joyice Faster, PA-C   Billie Lade, PhD, MD  This document serves as a record of services personally performed by Antony Blackbird, MD. It was created on his behalf by Neena Rhymes, a trained medical scribe. The creation of this record is based on the scribe's personal observations and the provider's statements to them. This document has been checked and approved by the attending provider.

## 2022-09-01 ENCOUNTER — Ambulatory Visit
Admission: RE | Admit: 2022-09-01 | Discharge: 2022-09-01 | Disposition: A | Discharge status: Home / Self Care | Payer: Medicare PPO | Source: Ambulatory Visit | Attending: Radiation Oncology | Admitting: Radiation Oncology

## 2022-09-01 ENCOUNTER — Other Ambulatory Visit: Payer: Self-pay

## 2022-09-01 VITALS — BP 107/62 | HR 100 | Temp 97.2°F | Resp 20 | Ht 65.0 in | Wt 124.2 lb

## 2022-09-01 DIAGNOSIS — Z170 Estrogen receptor positive status [ER+]: Secondary | ICD-10-CM

## 2022-09-01 DIAGNOSIS — Z510 Encounter for antineoplastic radiation therapy: Secondary | ICD-10-CM | POA: Insufficient documentation

## 2022-09-01 DIAGNOSIS — Z1501 Genetic susceptibility to malignant neoplasm of breast: Secondary | ICD-10-CM | POA: Insufficient documentation

## 2022-09-01 DIAGNOSIS — C50412 Malignant neoplasm of upper-outer quadrant of left female breast: Secondary | ICD-10-CM | POA: Insufficient documentation

## 2022-09-01 DIAGNOSIS — Z79899 Other long term (current) drug therapy: Secondary | ICD-10-CM | POA: Diagnosis not present

## 2022-09-06 DIAGNOSIS — Z510 Encounter for antineoplastic radiation therapy: Secondary | ICD-10-CM | POA: Diagnosis not present

## 2022-09-06 DIAGNOSIS — C50412 Malignant neoplasm of upper-outer quadrant of left female breast: Secondary | ICD-10-CM | POA: Diagnosis not present

## 2022-09-06 DIAGNOSIS — Z170 Estrogen receptor positive status [ER+]: Secondary | ICD-10-CM | POA: Diagnosis not present

## 2022-09-07 ENCOUNTER — Encounter: Payer: Self-pay | Admitting: *Deleted

## 2022-09-07 DIAGNOSIS — C50412 Malignant neoplasm of upper-outer quadrant of left female breast: Secondary | ICD-10-CM

## 2022-09-12 ENCOUNTER — Other Ambulatory Visit: Payer: Self-pay

## 2022-09-12 ENCOUNTER — Ambulatory Visit
Admission: RE | Admit: 2022-09-12 | Discharge: 2022-09-12 | Disposition: A | Discharge status: Home / Self Care | Payer: Medicare PPO | Source: Ambulatory Visit | Attending: Radiation Oncology | Admitting: Radiation Oncology

## 2022-09-12 DIAGNOSIS — Z170 Estrogen receptor positive status [ER+]: Secondary | ICD-10-CM | POA: Diagnosis not present

## 2022-09-12 DIAGNOSIS — C50412 Malignant neoplasm of upper-outer quadrant of left female breast: Secondary | ICD-10-CM

## 2022-09-12 DIAGNOSIS — Z510 Encounter for antineoplastic radiation therapy: Secondary | ICD-10-CM | POA: Diagnosis not present

## 2022-09-12 LAB — RAD ONC ARIA SESSION SUMMARY
Course Elapsed Days: 0
Plan Fractions Treated to Date: 1
Plan Prescribed Dose Per Fraction: 2.67 Gy
Plan Total Fractions Prescribed: 15
Plan Total Prescribed Dose: 40.05 Gy
Reference Point Dosage Given to Date: 2.67 Gy
Reference Point Session Dosage Given: 2.67 Gy
Session Number: 1

## 2022-09-13 ENCOUNTER — Other Ambulatory Visit: Payer: Self-pay

## 2022-09-13 ENCOUNTER — Ambulatory Visit
Admission: RE | Admit: 2022-09-13 | Discharge: 2022-09-13 | Disposition: A | Discharge status: Home / Self Care | Payer: Medicare PPO | Source: Ambulatory Visit | Attending: Radiation Oncology | Admitting: Radiation Oncology

## 2022-09-13 DIAGNOSIS — Z170 Estrogen receptor positive status [ER+]: Secondary | ICD-10-CM

## 2022-09-13 DIAGNOSIS — Z510 Encounter for antineoplastic radiation therapy: Secondary | ICD-10-CM | POA: Diagnosis not present

## 2022-09-13 DIAGNOSIS — C50412 Malignant neoplasm of upper-outer quadrant of left female breast: Secondary | ICD-10-CM | POA: Diagnosis not present

## 2022-09-13 LAB — RAD ONC ARIA SESSION SUMMARY
Course Elapsed Days: 1
Plan Fractions Treated to Date: 2
Plan Prescribed Dose Per Fraction: 2.67 Gy
Plan Total Fractions Prescribed: 15
Plan Total Prescribed Dose: 40.05 Gy
Reference Point Dosage Given to Date: 5.34 Gy
Reference Point Session Dosage Given: 2.67 Gy
Session Number: 2

## 2022-09-13 MED ORDER — SONAFINE EX EMUL
1.0000 | Freq: Once | CUTANEOUS | Status: AC
  Administered 2022-09-13: 1 via TOPICAL

## 2022-09-13 MED ORDER — ALRA NON-METALLIC DEODORANT (RAD-ONC)
1.0000 | Freq: Once | TOPICAL | Status: AC
  Administered 2022-09-13: 1 via TOPICAL

## 2022-09-14 ENCOUNTER — Other Ambulatory Visit: Payer: Self-pay

## 2022-09-14 ENCOUNTER — Ambulatory Visit
Admission: RE | Admit: 2022-09-14 | Discharge: 2022-09-14 | Disposition: A | Discharge status: Home / Self Care | Payer: Medicare PPO | Source: Ambulatory Visit | Attending: Radiation Oncology | Admitting: Radiation Oncology

## 2022-09-14 DIAGNOSIS — C50412 Malignant neoplasm of upper-outer quadrant of left female breast: Secondary | ICD-10-CM | POA: Diagnosis not present

## 2022-09-14 DIAGNOSIS — Z510 Encounter for antineoplastic radiation therapy: Secondary | ICD-10-CM | POA: Diagnosis not present

## 2022-09-14 DIAGNOSIS — Z170 Estrogen receptor positive status [ER+]: Secondary | ICD-10-CM | POA: Diagnosis not present

## 2022-09-14 LAB — RAD ONC ARIA SESSION SUMMARY
Course Elapsed Days: 2
Plan Fractions Treated to Date: 3
Plan Prescribed Dose Per Fraction: 2.67 Gy
Plan Total Fractions Prescribed: 15
Plan Total Prescribed Dose: 40.05 Gy
Reference Point Dosage Given to Date: 8.01 Gy
Reference Point Session Dosage Given: 2.67 Gy
Session Number: 3

## 2022-09-15 ENCOUNTER — Other Ambulatory Visit: Payer: Self-pay

## 2022-09-15 ENCOUNTER — Ambulatory Visit
Admission: RE | Admit: 2022-09-15 | Discharge: 2022-09-15 | Disposition: A | Discharge status: Home / Self Care | Payer: Medicare PPO | Source: Ambulatory Visit | Attending: Radiation Oncology | Admitting: Radiation Oncology

## 2022-09-15 DIAGNOSIS — C50412 Malignant neoplasm of upper-outer quadrant of left female breast: Secondary | ICD-10-CM | POA: Insufficient documentation

## 2022-09-15 DIAGNOSIS — Z170 Estrogen receptor positive status [ER+]: Secondary | ICD-10-CM | POA: Insufficient documentation

## 2022-09-15 LAB — RAD ONC ARIA SESSION SUMMARY
Course Elapsed Days: 3
Plan Fractions Treated to Date: 4
Plan Prescribed Dose Per Fraction: 2.67 Gy
Plan Total Fractions Prescribed: 15
Plan Total Prescribed Dose: 40.05 Gy
Reference Point Dosage Given to Date: 10.68 Gy
Reference Point Session Dosage Given: 2.67 Gy
Session Number: 4

## 2022-09-16 ENCOUNTER — Other Ambulatory Visit: Payer: Self-pay

## 2022-09-16 ENCOUNTER — Ambulatory Visit
Admission: RE | Admit: 2022-09-16 | Discharge: 2022-09-16 | Disposition: A | Discharge status: Home / Self Care | Payer: Medicare PPO | Source: Ambulatory Visit | Attending: Radiation Oncology | Admitting: Radiation Oncology

## 2022-09-16 DIAGNOSIS — C50412 Malignant neoplasm of upper-outer quadrant of left female breast: Secondary | ICD-10-CM | POA: Diagnosis not present

## 2022-09-16 DIAGNOSIS — Z170 Estrogen receptor positive status [ER+]: Secondary | ICD-10-CM | POA: Diagnosis not present

## 2022-09-16 DIAGNOSIS — Z510 Encounter for antineoplastic radiation therapy: Secondary | ICD-10-CM | POA: Diagnosis not present

## 2022-09-16 LAB — RAD ONC ARIA SESSION SUMMARY
Course Elapsed Days: 4
Plan Fractions Treated to Date: 5
Plan Prescribed Dose Per Fraction: 2.67 Gy
Plan Total Fractions Prescribed: 15
Plan Total Prescribed Dose: 40.05 Gy
Reference Point Dosage Given to Date: 13.35 Gy
Reference Point Session Dosage Given: 2.67 Gy
Session Number: 5

## 2022-09-19 ENCOUNTER — Ambulatory Visit
Admission: RE | Admit: 2022-09-19 | Discharge: 2022-09-19 | Disposition: A | Discharge status: Home / Self Care | Payer: Medicare PPO | Source: Ambulatory Visit | Attending: Radiation Oncology | Admitting: Radiation Oncology

## 2022-09-19 ENCOUNTER — Other Ambulatory Visit: Payer: Self-pay

## 2022-09-19 DIAGNOSIS — Z510 Encounter for antineoplastic radiation therapy: Secondary | ICD-10-CM | POA: Diagnosis not present

## 2022-09-19 DIAGNOSIS — C50412 Malignant neoplasm of upper-outer quadrant of left female breast: Secondary | ICD-10-CM | POA: Diagnosis not present

## 2022-09-19 DIAGNOSIS — Z170 Estrogen receptor positive status [ER+]: Secondary | ICD-10-CM | POA: Diagnosis not present

## 2022-09-19 LAB — RAD ONC ARIA SESSION SUMMARY
Course Elapsed Days: 7
Plan Fractions Treated to Date: 6
Plan Prescribed Dose Per Fraction: 2.67 Gy
Plan Total Fractions Prescribed: 15
Plan Total Prescribed Dose: 40.05 Gy
Reference Point Dosage Given to Date: 16.02 Gy
Reference Point Session Dosage Given: 2.67 Gy
Session Number: 6

## 2022-09-20 ENCOUNTER — Other Ambulatory Visit: Payer: Self-pay

## 2022-09-20 ENCOUNTER — Ambulatory Visit
Admission: RE | Admit: 2022-09-20 | Discharge: 2022-09-20 | Disposition: A | Discharge status: Home / Self Care | Payer: Medicare PPO | Source: Ambulatory Visit | Attending: Radiation Oncology | Admitting: Radiation Oncology

## 2022-09-20 ENCOUNTER — Ambulatory Visit: Admission: RE | Admit: 2022-09-20 | Payer: Medicare PPO | Source: Ambulatory Visit

## 2022-09-20 DIAGNOSIS — C50412 Malignant neoplasm of upper-outer quadrant of left female breast: Secondary | ICD-10-CM | POA: Diagnosis not present

## 2022-09-20 DIAGNOSIS — Z170 Estrogen receptor positive status [ER+]: Secondary | ICD-10-CM | POA: Diagnosis not present

## 2022-09-20 DIAGNOSIS — Z510 Encounter for antineoplastic radiation therapy: Secondary | ICD-10-CM | POA: Diagnosis not present

## 2022-09-20 LAB — RAD ONC ARIA SESSION SUMMARY
Course Elapsed Days: 8
Plan Fractions Treated to Date: 7
Plan Prescribed Dose Per Fraction: 2.67 Gy
Plan Total Fractions Prescribed: 15
Plan Total Prescribed Dose: 40.05 Gy
Reference Point Dosage Given to Date: 18.69 Gy
Reference Point Session Dosage Given: 2.67 Gy
Session Number: 7

## 2022-09-21 ENCOUNTER — Ambulatory Visit
Admission: RE | Admit: 2022-09-21 | Discharge: 2022-09-21 | Disposition: A | Discharge status: Home / Self Care | Payer: Medicare PPO | Source: Ambulatory Visit | Attending: Radiation Oncology | Admitting: Radiation Oncology

## 2022-09-21 ENCOUNTER — Other Ambulatory Visit: Payer: Self-pay

## 2022-09-21 DIAGNOSIS — C50412 Malignant neoplasm of upper-outer quadrant of left female breast: Secondary | ICD-10-CM | POA: Diagnosis not present

## 2022-09-21 DIAGNOSIS — Z510 Encounter for antineoplastic radiation therapy: Secondary | ICD-10-CM | POA: Diagnosis not present

## 2022-09-21 DIAGNOSIS — Z170 Estrogen receptor positive status [ER+]: Secondary | ICD-10-CM | POA: Diagnosis not present

## 2022-09-21 LAB — RAD ONC ARIA SESSION SUMMARY
Course Elapsed Days: 9
Plan Fractions Treated to Date: 8
Plan Prescribed Dose Per Fraction: 2.67 Gy
Plan Total Fractions Prescribed: 15
Plan Total Prescribed Dose: 40.05 Gy
Reference Point Dosage Given to Date: 21.36 Gy
Reference Point Session Dosage Given: 2.67 Gy
Session Number: 8

## 2022-09-22 ENCOUNTER — Ambulatory Visit: Payer: Medicare PPO

## 2022-09-22 DIAGNOSIS — C50412 Malignant neoplasm of upper-outer quadrant of left female breast: Secondary | ICD-10-CM | POA: Diagnosis not present

## 2022-09-22 DIAGNOSIS — Z170 Estrogen receptor positive status [ER+]: Secondary | ICD-10-CM | POA: Diagnosis not present

## 2022-09-23 ENCOUNTER — Other Ambulatory Visit: Payer: Self-pay

## 2022-09-23 ENCOUNTER — Ambulatory Visit
Admission: RE | Admit: 2022-09-23 | Discharge: 2022-09-23 | Disposition: A | Discharge status: Home / Self Care | Payer: Medicare PPO | Source: Ambulatory Visit | Attending: Radiation Oncology | Admitting: Radiation Oncology

## 2022-09-23 DIAGNOSIS — Z170 Estrogen receptor positive status [ER+]: Secondary | ICD-10-CM | POA: Diagnosis not present

## 2022-09-23 DIAGNOSIS — C50412 Malignant neoplasm of upper-outer quadrant of left female breast: Secondary | ICD-10-CM | POA: Diagnosis not present

## 2022-09-23 DIAGNOSIS — Z510 Encounter for antineoplastic radiation therapy: Secondary | ICD-10-CM | POA: Diagnosis not present

## 2022-09-23 LAB — RAD ONC ARIA SESSION SUMMARY
Course Elapsed Days: 11
Plan Fractions Treated to Date: 9
Plan Prescribed Dose Per Fraction: 2.67 Gy
Plan Total Fractions Prescribed: 15
Plan Total Prescribed Dose: 40.05 Gy
Reference Point Dosage Given to Date: 24.03 Gy
Reference Point Session Dosage Given: 2.67 Gy
Session Number: 9

## 2022-09-26 ENCOUNTER — Ambulatory Visit
Admission: RE | Admit: 2022-09-26 | Discharge: 2022-09-26 | Disposition: A | Discharge status: Home / Self Care | Payer: Medicare PPO | Source: Ambulatory Visit | Attending: Radiation Oncology | Admitting: Radiation Oncology

## 2022-09-26 ENCOUNTER — Other Ambulatory Visit: Payer: Self-pay

## 2022-09-26 DIAGNOSIS — Z510 Encounter for antineoplastic radiation therapy: Secondary | ICD-10-CM | POA: Diagnosis not present

## 2022-09-26 DIAGNOSIS — Z170 Estrogen receptor positive status [ER+]: Secondary | ICD-10-CM | POA: Diagnosis not present

## 2022-09-26 DIAGNOSIS — C50412 Malignant neoplasm of upper-outer quadrant of left female breast: Secondary | ICD-10-CM | POA: Diagnosis not present

## 2022-09-26 LAB — RAD ONC ARIA SESSION SUMMARY
Course Elapsed Days: 14
Plan Fractions Treated to Date: 10
Plan Prescribed Dose Per Fraction: 2.67 Gy
Plan Total Fractions Prescribed: 15
Plan Total Prescribed Dose: 40.05 Gy
Reference Point Dosage Given to Date: 26.7 Gy
Reference Point Session Dosage Given: 2.67 Gy
Session Number: 10

## 2022-09-27 ENCOUNTER — Ambulatory Visit
Admission: RE | Admit: 2022-09-27 | Discharge: 2022-09-27 | Disposition: A | Discharge status: Home / Self Care | Payer: Medicare PPO | Source: Ambulatory Visit | Attending: Radiation Oncology | Admitting: Radiation Oncology

## 2022-09-27 ENCOUNTER — Other Ambulatory Visit: Payer: Self-pay

## 2022-09-27 ENCOUNTER — Ambulatory Visit: Payer: Medicare PPO

## 2022-09-27 ENCOUNTER — Ambulatory Visit: Payer: Medicare PPO | Admitting: Radiation Oncology

## 2022-09-27 DIAGNOSIS — C50412 Malignant neoplasm of upper-outer quadrant of left female breast: Secondary | ICD-10-CM | POA: Diagnosis not present

## 2022-09-27 DIAGNOSIS — Z510 Encounter for antineoplastic radiation therapy: Secondary | ICD-10-CM | POA: Diagnosis not present

## 2022-09-27 DIAGNOSIS — Z170 Estrogen receptor positive status [ER+]: Secondary | ICD-10-CM | POA: Diagnosis not present

## 2022-09-27 LAB — RAD ONC ARIA SESSION SUMMARY
Course Elapsed Days: 15
Plan Fractions Treated to Date: 11
Plan Prescribed Dose Per Fraction: 2.67 Gy
Plan Total Fractions Prescribed: 15
Plan Total Prescribed Dose: 40.05 Gy
Reference Point Dosage Given to Date: 29.37 Gy
Reference Point Session Dosage Given: 2.67 Gy
Session Number: 11

## 2022-09-28 ENCOUNTER — Ambulatory Visit: Admission: RE | Admit: 2022-09-28 | Payer: Medicare PPO | Source: Ambulatory Visit

## 2022-09-28 ENCOUNTER — Other Ambulatory Visit: Payer: Self-pay

## 2022-09-28 DIAGNOSIS — Z510 Encounter for antineoplastic radiation therapy: Secondary | ICD-10-CM | POA: Diagnosis not present

## 2022-09-28 DIAGNOSIS — Z170 Estrogen receptor positive status [ER+]: Secondary | ICD-10-CM | POA: Diagnosis not present

## 2022-09-28 DIAGNOSIS — C50412 Malignant neoplasm of upper-outer quadrant of left female breast: Secondary | ICD-10-CM | POA: Diagnosis not present

## 2022-09-28 LAB — RAD ONC ARIA SESSION SUMMARY
Course Elapsed Days: 16
Plan Fractions Treated to Date: 12
Plan Prescribed Dose Per Fraction: 2.67 Gy
Plan Total Fractions Prescribed: 15
Plan Total Prescribed Dose: 40.05 Gy
Reference Point Dosage Given to Date: 32.04 Gy
Reference Point Session Dosage Given: 2.67 Gy
Session Number: 12

## 2022-09-29 ENCOUNTER — Ambulatory Visit
Admission: RE | Admit: 2022-09-29 | Discharge: 2022-09-29 | Disposition: A | Discharge status: Home / Self Care | Payer: Medicare PPO | Source: Ambulatory Visit | Attending: Radiation Oncology | Admitting: Radiation Oncology

## 2022-09-29 ENCOUNTER — Encounter: Payer: Self-pay | Admitting: Genetic Counselor

## 2022-09-29 ENCOUNTER — Other Ambulatory Visit: Payer: Self-pay

## 2022-09-29 DIAGNOSIS — Z170 Estrogen receptor positive status [ER+]: Secondary | ICD-10-CM | POA: Diagnosis not present

## 2022-09-29 DIAGNOSIS — C50412 Malignant neoplasm of upper-outer quadrant of left female breast: Secondary | ICD-10-CM | POA: Diagnosis not present

## 2022-09-29 DIAGNOSIS — Z510 Encounter for antineoplastic radiation therapy: Secondary | ICD-10-CM | POA: Diagnosis not present

## 2022-09-29 LAB — RAD ONC ARIA SESSION SUMMARY
Course Elapsed Days: 17
Plan Fractions Treated to Date: 13
Plan Prescribed Dose Per Fraction: 2.67 Gy
Plan Total Fractions Prescribed: 15
Plan Total Prescribed Dose: 40.05 Gy
Reference Point Dosage Given to Date: 34.71 Gy
Reference Point Session Dosage Given: 2.67 Gy
Session Number: 13

## 2022-09-30 ENCOUNTER — Other Ambulatory Visit: Payer: Self-pay

## 2022-09-30 ENCOUNTER — Ambulatory Visit
Admission: RE | Admit: 2022-09-30 | Discharge: 2022-09-30 | Disposition: A | Discharge status: Home / Self Care | Payer: Medicare PPO | Source: Ambulatory Visit | Attending: Radiation Oncology | Admitting: Radiation Oncology

## 2022-09-30 DIAGNOSIS — Z510 Encounter for antineoplastic radiation therapy: Secondary | ICD-10-CM | POA: Diagnosis not present

## 2022-09-30 DIAGNOSIS — C50412 Malignant neoplasm of upper-outer quadrant of left female breast: Secondary | ICD-10-CM | POA: Diagnosis not present

## 2022-09-30 DIAGNOSIS — Z170 Estrogen receptor positive status [ER+]: Secondary | ICD-10-CM | POA: Diagnosis not present

## 2022-09-30 LAB — RAD ONC ARIA SESSION SUMMARY
Course Elapsed Days: 18
Plan Fractions Treated to Date: 14
Plan Prescribed Dose Per Fraction: 2.67 Gy
Plan Total Fractions Prescribed: 15
Plan Total Prescribed Dose: 40.05 Gy
Reference Point Dosage Given to Date: 37.38 Gy
Reference Point Session Dosage Given: 2.67 Gy
Session Number: 14

## 2022-10-03 ENCOUNTER — Other Ambulatory Visit: Payer: Self-pay

## 2022-10-03 ENCOUNTER — Ambulatory Visit
Admission: RE | Admit: 2022-10-03 | Discharge: 2022-10-03 | Disposition: A | Discharge status: Home / Self Care | Payer: Medicare PPO | Source: Ambulatory Visit | Attending: Radiation Oncology | Admitting: Radiation Oncology

## 2022-10-03 DIAGNOSIS — Z170 Estrogen receptor positive status [ER+]: Secondary | ICD-10-CM | POA: Diagnosis not present

## 2022-10-03 DIAGNOSIS — Z510 Encounter for antineoplastic radiation therapy: Secondary | ICD-10-CM | POA: Diagnosis not present

## 2022-10-03 DIAGNOSIS — C50412 Malignant neoplasm of upper-outer quadrant of left female breast: Secondary | ICD-10-CM | POA: Diagnosis not present

## 2022-10-03 LAB — RAD ONC ARIA SESSION SUMMARY
Course Elapsed Days: 21
Plan Fractions Treated to Date: 15
Plan Prescribed Dose Per Fraction: 2.67 Gy
Plan Total Fractions Prescribed: 15
Plan Total Prescribed Dose: 40.05 Gy
Reference Point Dosage Given to Date: 40.05 Gy
Reference Point Session Dosage Given: 2.67 Gy
Session Number: 15

## 2022-10-04 ENCOUNTER — Ambulatory Visit: Admission: RE | Admit: 2022-10-04 | Payer: Medicare PPO | Source: Ambulatory Visit

## 2022-10-04 ENCOUNTER — Ambulatory Visit
Admission: RE | Admit: 2022-10-04 | Discharge: 2022-10-04 | Disposition: A | Discharge status: Home / Self Care | Payer: Medicare PPO | Source: Ambulatory Visit | Attending: Radiation Oncology | Admitting: Radiation Oncology

## 2022-10-04 ENCOUNTER — Other Ambulatory Visit: Payer: Self-pay

## 2022-10-04 DIAGNOSIS — C50412 Malignant neoplasm of upper-outer quadrant of left female breast: Secondary | ICD-10-CM

## 2022-10-04 DIAGNOSIS — Z510 Encounter for antineoplastic radiation therapy: Secondary | ICD-10-CM | POA: Diagnosis not present

## 2022-10-04 DIAGNOSIS — Z170 Estrogen receptor positive status [ER+]: Secondary | ICD-10-CM | POA: Diagnosis not present

## 2022-10-04 LAB — RAD ONC ARIA SESSION SUMMARY
Course Elapsed Days: 22
Plan Fractions Treated to Date: 1
Plan Prescribed Dose Per Fraction: 2 Gy
Plan Total Fractions Prescribed: 6
Plan Total Prescribed Dose: 12 Gy
Reference Point Dosage Given to Date: 2 Gy
Reference Point Session Dosage Given: 2 Gy
Session Number: 16

## 2022-10-04 MED ORDER — SONAFINE EX EMUL
1.0000 | Freq: Once | CUTANEOUS | Status: AC
  Administered 2022-10-04: 1 via TOPICAL

## 2022-10-05 ENCOUNTER — Ambulatory Visit: Admission: RE | Admit: 2022-10-05 | Payer: Medicare PPO | Source: Ambulatory Visit

## 2022-10-05 ENCOUNTER — Other Ambulatory Visit: Payer: Self-pay

## 2022-10-05 DIAGNOSIS — Z170 Estrogen receptor positive status [ER+]: Secondary | ICD-10-CM | POA: Diagnosis not present

## 2022-10-05 DIAGNOSIS — C50412 Malignant neoplasm of upper-outer quadrant of left female breast: Secondary | ICD-10-CM | POA: Diagnosis not present

## 2022-10-05 LAB — RAD ONC ARIA SESSION SUMMARY
Course Elapsed Days: 23
Plan Fractions Treated to Date: 2
Plan Prescribed Dose Per Fraction: 2 Gy
Plan Total Fractions Prescribed: 6
Plan Total Prescribed Dose: 12 Gy
Reference Point Dosage Given to Date: 4 Gy
Reference Point Session Dosage Given: 2 Gy
Session Number: 17

## 2022-10-06 ENCOUNTER — Ambulatory Visit
Admission: RE | Admit: 2022-10-06 | Discharge: 2022-10-06 | Disposition: A | Discharge status: Home / Self Care | Payer: Medicare PPO | Source: Ambulatory Visit | Attending: Radiation Oncology | Admitting: Radiation Oncology

## 2022-10-06 ENCOUNTER — Other Ambulatory Visit: Payer: Self-pay

## 2022-10-06 DIAGNOSIS — Z170 Estrogen receptor positive status [ER+]: Secondary | ICD-10-CM | POA: Diagnosis not present

## 2022-10-06 DIAGNOSIS — C50412 Malignant neoplasm of upper-outer quadrant of left female breast: Secondary | ICD-10-CM | POA: Diagnosis not present

## 2022-10-06 LAB — RAD ONC ARIA SESSION SUMMARY
Course Elapsed Days: 24
Plan Fractions Treated to Date: 3
Plan Prescribed Dose Per Fraction: 2 Gy
Plan Total Fractions Prescribed: 6
Plan Total Prescribed Dose: 12 Gy
Reference Point Dosage Given to Date: 6 Gy
Reference Point Session Dosage Given: 2 Gy
Session Number: 18

## 2022-10-07 ENCOUNTER — Other Ambulatory Visit: Payer: Self-pay

## 2022-10-07 ENCOUNTER — Ambulatory Visit
Admission: RE | Admit: 2022-10-07 | Discharge: 2022-10-07 | Disposition: A | Discharge status: Home / Self Care | Payer: Medicare PPO | Source: Ambulatory Visit | Attending: Radiation Oncology | Admitting: Radiation Oncology

## 2022-10-07 DIAGNOSIS — C50412 Malignant neoplasm of upper-outer quadrant of left female breast: Secondary | ICD-10-CM | POA: Diagnosis not present

## 2022-10-07 DIAGNOSIS — Z170 Estrogen receptor positive status [ER+]: Secondary | ICD-10-CM | POA: Diagnosis not present

## 2022-10-07 LAB — RAD ONC ARIA SESSION SUMMARY
Course Elapsed Days: 25
Plan Fractions Treated to Date: 4
Plan Prescribed Dose Per Fraction: 2 Gy
Plan Total Fractions Prescribed: 6
Plan Total Prescribed Dose: 12 Gy
Reference Point Dosage Given to Date: 8 Gy
Reference Point Session Dosage Given: 2 Gy
Session Number: 19

## 2022-10-10 ENCOUNTER — Other Ambulatory Visit: Payer: Self-pay

## 2022-10-10 ENCOUNTER — Ambulatory Visit
Admission: RE | Admit: 2022-10-10 | Discharge: 2022-10-10 | Disposition: A | Discharge status: Home / Self Care | Payer: Medicare PPO | Source: Ambulatory Visit | Attending: Radiation Oncology | Admitting: Radiation Oncology

## 2022-10-10 ENCOUNTER — Ambulatory Visit: Payer: Medicare PPO

## 2022-10-10 DIAGNOSIS — C50412 Malignant neoplasm of upper-outer quadrant of left female breast: Secondary | ICD-10-CM | POA: Diagnosis not present

## 2022-10-10 DIAGNOSIS — Z170 Estrogen receptor positive status [ER+]: Secondary | ICD-10-CM | POA: Diagnosis not present

## 2022-10-10 LAB — RAD ONC ARIA SESSION SUMMARY
Course Elapsed Days: 28
Plan Fractions Treated to Date: 5
Plan Prescribed Dose Per Fraction: 2 Gy
Plan Total Fractions Prescribed: 6
Plan Total Prescribed Dose: 12 Gy
Reference Point Dosage Given to Date: 10 Gy
Reference Point Session Dosage Given: 2 Gy
Session Number: 20

## 2022-10-11 ENCOUNTER — Inpatient Hospital Stay: Payer: Medicare PPO | Attending: Hematology and Oncology | Admitting: Hematology and Oncology

## 2022-10-11 ENCOUNTER — Ambulatory Visit
Admission: RE | Admit: 2022-10-11 | Discharge: 2022-10-11 | Disposition: A | Discharge status: Home / Self Care | Payer: Medicare PPO | Source: Ambulatory Visit | Attending: Radiation Oncology | Admitting: Radiation Oncology

## 2022-10-11 ENCOUNTER — Other Ambulatory Visit: Payer: Self-pay

## 2022-10-11 VITALS — BP 126/57 | HR 91 | Temp 97.3°F | Resp 18 | Ht 65.0 in | Wt 124.8 lb

## 2022-10-11 DIAGNOSIS — Z510 Encounter for antineoplastic radiation therapy: Secondary | ICD-10-CM | POA: Insufficient documentation

## 2022-10-11 DIAGNOSIS — C50412 Malignant neoplasm of upper-outer quadrant of left female breast: Secondary | ICD-10-CM | POA: Insufficient documentation

## 2022-10-11 DIAGNOSIS — Z79811 Long term (current) use of aromatase inhibitors: Secondary | ICD-10-CM | POA: Diagnosis not present

## 2022-10-11 DIAGNOSIS — Z170 Estrogen receptor positive status [ER+]: Secondary | ICD-10-CM | POA: Insufficient documentation

## 2022-10-11 LAB — RAD ONC ARIA SESSION SUMMARY
Course Elapsed Days: 29
Plan Fractions Treated to Date: 6
Plan Prescribed Dose Per Fraction: 2 Gy
Plan Total Fractions Prescribed: 6
Plan Total Prescribed Dose: 12 Gy
Reference Point Dosage Given to Date: 12 Gy
Reference Point Session Dosage Given: 2 Gy
Session Number: 21

## 2022-10-11 MED ORDER — ANASTROZOLE 1 MG PO TABS: 1.0000 mg | ORAL_TABLET | Freq: Every day | ORAL | 3 refills | Status: DC

## 2022-10-11 NOTE — Assessment & Plan Note (Signed)
06/24/2022:Screening mammogram detected left breast asymmetry UOQ.  Two abnormalities, 2 o'clock position 5 mm biopsy: Grade 1 IDC with DCIS ER 100%, PR 100%, HER2 negative 1+, Ki-67 5%; additional area 9 mm biopsy: Benign, axilla negative    08/03/2022: Left lumpectomy: Grade 1 IDC 0.8 cm, low-grade DCIS, LVI not present, margins negative, ER 100%, PR 100%, HER2 1+ negative, Ki-67 5% Did not recommend Oncotype DX testing for grade 1 tumor less than 1 cm.  Adjuvant radiation: 09/13/2022-10/11/2022 Treatment plan: Adjuvant antiestrogen therapy with anastrozole 1 mg daily x 5 years Anastrozole counseling: We discussed the risks and benefits of anti-estrogen therapy with aromatase inhibitors. These include but not limited to insomnia, hot flashes, mood changes, vaginal dryness, bone density loss, and weight gain. We strongly believe that the benefits far outweigh the risks. Patient understands these risks and consented to starting treatment. Planned treatment duration is 5 years.  Patient is able to get around with the help of a walker Return to clinic in 3 months for survivorship care plan visit

## 2022-10-11 NOTE — Progress Notes (Signed)
Patient Care Team: [name], MD as PCP - General (Family Medicine) Tat, Octaviano Batty, DO as Consulting Physician (Neurology) Pershing Proud, RN as Oncology Nurse Navigator Donnelly Angelica, RN as Oncology Nurse Navigator [name], MD as Consulting Physician (Hematology and Oncology) Harriette Bouillon, MD as Consulting Physician (General Surgery) Antony Blackbird, MD as Consulting Physician (Radiation Oncology)  DIAGNOSIS:  Encounter Diagnosis  Name Primary?   Malignant neoplasm of upper-outer quadrant of left breast in female, estrogen receptor positive (HCC) Yes    SUMMARY OF ONCOLOGIC HISTORY: Oncology History  Malignant neoplasm of upper-outer quadrant of left breast in female, estrogen receptor positive (HCC)  06/24/2022 Initial Diagnosis   Screening mammogram detected left breast asymmetry UOQ.  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 The remaining 42 genes were Negative. Report date is 07/15/2022.  The Custom Hereditary Cancers Panel offered by [facility] includes sequencing and/or deletion duplication testing of the following 43 genes: APC, ATM, AXIN2, BAP1, BARD1, BMPR1A, BRCA1, BRCA2, BRIP1, CDH1, CDK4, CDKN2A (p14ARF and p16INK4a only), CHEK2, CTNNA1, EPCAM (Deletion/duplication testing only), FH, GREM1 (promoter region duplication testing only), HOXB13, KIT, MBD4, MEN1, MLH1, MSH2, MSH3, MSH6, MUTYH, NF1, NHTL1, PALB2, PDGFRA, PMS2, POLD1, POLE, PTEN, RAD51C, RAD51D,  SMAD4, SMARCA4. STK11, TP53, TSC1, TSC2, and VHL.     CHIEF COMPLIANT: Follow-up after radiation  INTERVAL HISTORY: Michele Swanson is a 67 y.o. female is here because of recent diagnosis of left breast cancer.  She presents to the clinic for a follow-up. Patient reports that radiation went well. She states that she did have some radiation dermatitis and itching of the skin.    ALLERGIES:  is allergic to [employer] tartrate], diclofenac, diclofenac sodium, hydrochlorothiazide w-triamterene, hydrocodone-acetaminophen, voltaren [diclofenac sodium], and zolpidem.  MEDICATIONS:  Current Outpatient Medications  Medication Sig Dispense Refill   amLODipine (NORVASC) 5 MG tablet Take 5 mg by mouth daily.     anastrozole (ARIMIDEX) 1 MG tablet Take 1 tablet (1 mg total) by mouth daily. 90 tablet 3   Ascorbic Acid (VITAMIN C) 100 MG tablet Take 100 mg by mouth daily.     Cholecalciferol (VITAMIN D3) 50 MCG (2000 UT) capsule Take 2,000 Units by mouth daily.     Eszopiclone (ESZOPICLONE) 3 MG TABS Take 3 mg by mouth at bedtime. Take immediately before bedtime     hydrochlorothiazide (HYDRODIURIL) 12.5 MG tablet Take 12.5 mg by mouth daily.     irbesartan (AVAPRO) 150 MG tablet Take 150 mg by mouth daily.     MITIGARE 0.6 MG CAPS Take by mouth as needed. Take as directed     Venlafaxine HCl 75 MG TB24 Take 75 mg by mouth in the morning and at bedtime.      vitamin B-12 (CYANOCOBALAMIN) 1000 MCG tablet Take 1,000 mcg by mouth daily.     oxyCODONE (OXY IR/ROXICODONE) 5 MG immediate release tablet Take 1 tablet (5 mg total) by mouth every 6 (six) hours as needed for severe pain. (Patient not  taking: Reported on 08/31/2022) 15 tablet 0   No current facility-administered medications for this visit.    PHYSICAL EXAMINATION: ECOG PERFORMANCE STATUS: 1 - Symptomatic but completely ambulatory  Vitals:   10/11/22 1011  BP: (!) 126/57  Pulse: 91  Resp: 18  Temp: (!) 97.3 F (36.3 C)  SpO2: 95%    Filed Weights   10/11/22 1011  Weight: 124 lb 12.8 oz (56.6 kg)      LABORATORY DATA:  I have reviewed the data as listed    Latest Ref Rng & Units 07/06/2022   11:30 AM 06/24/2019    1:34 PM 01/25/2019    9:25 AM  CMP  Glucose 70 - 99 mg/dL 782   956   BUN 8 - 23 mg/dL 19   21   Creatinine 2.13 - 1.00 mg/dL 0.86   5.78   Sodium 469 - 145 mmol/L 140   142   Potassium 3.5 - 5.1 mmol/L 4.7   4.5   Chloride 98 - 111 mmol/L 104   103   CO2 22 - 32 mmol/L 29   23   Calcium 8.9 - 10.3 mg/dL 9.7   9.5   Total Protein 6.5 - 8.1 g/dL 6.8  7.0  6.2   Total Bilirubin 0.3 - 1.2 mg/dL 0.6   0.2   Alkaline Phos 38 - 126 U/L 104   118   AST 15 - 41 U/L 17   12   ALT 0 - 44 U/L 16   10     Lab Results  Component Value Date   WBC 6.9 07/06/2022   HGB 14.0 07/06/2022   HCT 41.2 07/06/2022   MCV 108.4 (H) 07/06/2022   PLT 328 07/06/2022   NEUTROABS 5.1 07/06/2022    ASSESSMENT & PLAN:  Malignant neoplasm of upper-outer quadrant of left breast in female, estrogen receptor positive (HCC) 06/24/2022:Screening mammogram detected left breast asymmetry UOQ.  Two abnormalities, 2 o'clock position 5 mm biopsy: Grade 1 IDC with DCIS ER 100%, PR 100%, HER2 negative 1+, Ki-67 5%; additional area 9 mm biopsy: Benign, axilla negative    08/03/2022: Left lumpectomy: Grade 1 IDC 0.8 cm, low-grade DCIS, LVI not present, margins negative, ER 100%, PR 100%, HER2 1+ negative, Ki-67 5% Did not recommend Oncotype DX testing for grade 1 tumor less than 1 cm.  Adjuvant radiation: 09/13/2022-10/11/2022 Treatment plan: Adjuvant antiestrogen therapy with anastrozole 1 mg daily x 5 years Anastrozole counseling: We discussed the risks and benefits of anti-estrogen therapy with aromatase inhibitors. These include but not limited to insomnia, hot flashes, mood changes, vaginal dryness, bone density loss, and weight gain. We strongly believe that the benefits far outweigh the risks. Patient understands these risks and  consented to starting treatment. Planned treatment duration is 5 years.  Patient is able to get around with the help of a walker Return to clinic in 3 months for survivorship care plan visit    No orders of the defined types were placed in this encounter.  The patient has a good understanding of the overall plan. she agrees with it. she will call with any problems that may develop before the next visit here. Total time spent: 30 mins including face to face time and time spent for planning, charting and co-ordination of care   Tamsen Meek, MD 10/11/22    I Janan Ridge am acting as a Neurosurgeon for Dr.Shannah Conteh  I have reviewed the above documentation for accuracy and completeness, and I agree  with the above.

## 2022-10-12 DIAGNOSIS — Z23 Encounter for immunization: Secondary | ICD-10-CM | POA: Diagnosis not present

## 2022-10-13 NOTE — Radiation Completion Notes (Signed)
Patient Name: Michele Swanson, Michele Swanson MRN: [id number] Date of Birth: [birth year] Referring Physician: [name], M.D. Date of Service: 2022-10-13 Radiation Oncologist: Arnette Schaumann, M.D. [facility] [place]                             RADIATION ONCOLOGY END OF TREATMENT NOTE     Diagnosis: C50.412 Malignant neoplasm of upper-outer quadrant of left female breast Staging on 2022-07-06: Malignant neoplasm of upper-outer quadrant of left breast in female, estrogen receptor positive (HCC) T=cT1a, N=cN0, M=cM0 Intent: Curative     ==========DELIVERED PLANS==========  First Treatment Date: 2022-09-12 - Last Treatment Date: 2022-10-11   Plan Name: Breast_L_BH Site: Breast, Left Technique: 3D Mode: Photon Dose Per Fraction: 2.67 Gy Prescribed Dose (Delivered / Prescribed): 40.05 Gy / 40.05 Gy Prescribed Fxs (Delivered / Prescribed): 15 / 15   Plan Name: Brst_L_Bst_BH Site: Breast, Left Technique: 3D Mode: Photon Dose Per Fraction: 2 Gy Prescribed Dose (Delivered / Prescribed): 12 Gy / 12 Gy Prescribed Fxs (Delivered / Prescribed): 6 / 6     ==========ON TREATMENT VISIT DATES========== 2022-09-13, 2022-09-20, 2022-09-27, 2022-10-04, 2022-10-11     ==========UPCOMING VISITS==========       ==========APPENDIX - ON TREATMENT VISIT NOTES==========   See weekly On Treatment Notes in Epic for details.

## 2022-11-09 ENCOUNTER — Encounter: Payer: Self-pay | Admitting: Radiation Oncology

## 2022-11-09 DIAGNOSIS — Z23 Encounter for immunization: Secondary | ICD-10-CM | POA: Diagnosis not present

## 2022-11-13 NOTE — Progress Notes (Signed)
Radiation Oncology         (336) [phone] ________________________________  Name: Michele Swanson MRN: [id number]  Date: 11/14/2022  DOB: 1955-10-06  Follow-Up Visit Note  CC: [name], MD  [name], MD  No diagnosis found.  Diagnosis: Stage IA (cT1a, cN0, cM0) Left Breast UOQ, Invasive ductal/tubular carcinoma with low grade DCIS, ER+ / PR+ / Her2-, Grade 1: s/p lumpectomy      Interval Since Last Radiation: 1 month and 3 days   Indication for treatment: Curative       Radiation treatment dates:  09/12/22 through 10/11/22 Site/dose:  1) Left breast - 40.05 Gy delivered in 15 Fx at 2.67 Gy/Fx 2) Left breast boost - 12 Gy delivered in 6 Fx at 2 Gy/Fx Technique/Mode: 3D / Photon  Beams/energy:  10X, 6X-FFF  Narrative:  The patient returns today for routine follow-up.  The patient tolerated radiation treatment relatively well. On the date of her final treatment, the patient endorsed intermittent pain rated at a 6/10 to left nipple and under the left breast, fatigue, redness, and itching. Physical exam performed on that same date showed diffuse erythema and hyperpigmentation changes to the left breast area.  No skin breakdown was appreciated.   She also followed up with Dr. Pamelia Hoit on the date of her final radiation treatment. Based on Dr. Earmon Phoenix recommendation, she has opted to proceed with antiestrogen therapy consisting of anastrozole x 5 years.   No other significant interval history since she completed XRT.                               ***  Allergies:  is allergic to [employer] tartrate], diclofenac, diclofenac sodium, hydrochlorothiazide w-triamterene, hydrocodone-acetaminophen, voltaren [diclofenac sodium], and zolpidem.  Meds: Current Outpatient Medications  Medication Sig Dispense Refill   amLODipine (NORVASC) 5 MG tablet Take 5 mg by mouth daily.     anastrozole (ARIMIDEX) 1 MG tablet Take 1 tablet (1 mg total) by mouth daily. 90 tablet 3   Ascorbic Acid  (VITAMIN C) 100 MG tablet Take 100 mg by mouth daily.     Cholecalciferol (VITAMIN D3) 50 MCG (2000 UT) capsule Take 2,000 Units by mouth daily.     Eszopiclone (ESZOPICLONE) 3 MG TABS Take 3 mg by mouth at bedtime. Take immediately before bedtime     hydrochlorothiazide (HYDRODIURIL) 12.5 MG tablet Take 12.5 mg by mouth daily.     irbesartan (AVAPRO) 150 MG tablet Take 150 mg by mouth daily.     MITIGARE 0.6 MG CAPS Take by mouth as needed. Take as directed     oxyCODONE (OXY IR/ROXICODONE) 5 MG immediate release tablet Take 1 tablet (5 mg total) by mouth every 6 (six) hours as needed for severe pain. (Patient not taking: Reported on 08/31/2022) 15 tablet 0   Venlafaxine HCl 75 MG TB24 Take 75 mg by mouth in the morning and at bedtime.      vitamin B-12 (CYANOCOBALAMIN) 1000 MCG tablet Take 1,000 mcg by mouth daily.     No current facility-administered medications for this encounter.    Physical Findings: The patient is in no acute distress. Patient is alert and oriented.  vitals were not taken for this visit. .  No significant changes. Lungs are clear to auscultation bilaterally. Heart has regular rate and rhythm. No palpable cervical, supraclavicular, or axillary adenopathy. Abdomen soft, non-tender, normal bowel sounds.  Right Breast: no palpable mass, nipple discharge or  bleeding. Left Breast: ***  Lab Findings: Lab Results  Component Value Date   WBC 6.9 07/06/2022   HGB 14.0 07/06/2022   HCT 41.2 07/06/2022   MCV 108.4 (H) 07/06/2022   PLT 328 07/06/2022    Radiographic Findings: No results found.  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  ***  Plan:  ***   *** minutes of total time was spent for this patient encounter, including preparation, face-to-face counseling with the patient and coordination  of care, physical exam, and documentation of the encounter. ____________________________________  Billie Lade, PhD, MD  This document serves as a record of services personally performed by Antony Blackbird, MD. It was created on his behalf by Neena Rhymes, a trained medical scribe. The creation of this record is based on the scribe's personal observations and the provider's statements to them. This document has been checked and approved by the attending provider.

## 2022-11-13 NOTE — Progress Notes (Signed)
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 On the date of her final treatment, the patient endorsed intermittent pain rated at a 6/10 to left nipple and under the left breast, fatigue, redness, and itching. Physical exam performed on that same date showed diffuse erythema and hyperpigmentation changes to the left breast area.  No skin breakdown was appreciated.  Plan: The patient has completed radiation treatment. The patient will return to radiation oncology clinic for routine followup in one month. I advised them to call or return sooner if they have any questions or concerns related to their recovery or treatment.  -----------------------------------  Billie Lade, PhD, MD  This document serves as a record of services personally performed by Antony Blackbird, MD. It was created on his behalf by Neena Rhymes, a trained medical scribe. The creation of this record is based on the scribe's personal observations and the provider's statements to them. This document has been checked and approved by the attending provider.

## 2022-11-14 ENCOUNTER — Encounter: Payer: Self-pay | Admitting: Radiation Oncology

## 2022-11-14 ENCOUNTER — Ambulatory Visit
Admission: RE | Admit: 2022-11-14 | Discharge: 2022-11-14 | Disposition: A | Discharge status: Home / Self Care | Payer: Medicare PPO | Source: Ambulatory Visit | Attending: Radiation Oncology | Admitting: Radiation Oncology

## 2022-11-14 VITALS — BP 120/67 | HR 78 | Temp 97.9°F | Resp 20 | Ht 65.0 in | Wt 120.4 lb

## 2022-11-14 DIAGNOSIS — Z923 Personal history of irradiation: Secondary | ICD-10-CM | POA: Insufficient documentation

## 2022-11-14 DIAGNOSIS — Z170 Estrogen receptor positive status [ER+]: Secondary | ICD-10-CM | POA: Diagnosis not present

## 2022-11-14 DIAGNOSIS — C50412 Malignant neoplasm of upper-outer quadrant of left female breast: Secondary | ICD-10-CM | POA: Insufficient documentation

## 2022-11-14 HISTORY — DX: Personal history of irradiation: Z92.3

## 2022-11-14 NOTE — Progress Notes (Signed)
Michele Swanson is here today for follow up post radiation to the breast.   Breast Side:Left   They completed their radiation on: 10/11/2022  Does the patient complain of any of the following: Post radiation skin issues: She reports dry skin. Encouraged her to continue to use Radiaplex. Breast Tenderness: Yes, some soreness around nipple. Breast Swelling: No Lymphadema: No Range of Motion limitations: No Fatigue post radiation: Yes, moderate Appetite good/fair/poor: Poor. She reports not having an appetite.   BP 120/67 (BP Location: Left Arm, Patient Position: Sitting, Cuff Size: Normal)   Pulse 78   Temp 97.9 F (36.6 C)   Resp 20   Ht 5\' 5"  (1.651 m)   Wt 120 lb 6.4 oz (54.6 kg)   SpO2 96%   BMI 20.04 kg/m

## 2022-11-30 DIAGNOSIS — F331 Major depressive disorder, recurrent, moderate: Secondary | ICD-10-CM | POA: Diagnosis not present

## 2022-11-30 DIAGNOSIS — I700 Atherosclerosis of aorta: Secondary | ICD-10-CM | POA: Diagnosis not present

## 2022-11-30 DIAGNOSIS — E559 Vitamin D deficiency, unspecified: Secondary | ICD-10-CM | POA: Diagnosis not present

## 2022-11-30 DIAGNOSIS — G252 Other specified forms of tremor: Secondary | ICD-10-CM | POA: Diagnosis not present

## 2022-11-30 DIAGNOSIS — E7800 Pure hypercholesterolemia, unspecified: Secondary | ICD-10-CM | POA: Diagnosis not present

## 2022-11-30 DIAGNOSIS — I10 Essential (primary) hypertension: Secondary | ICD-10-CM | POA: Diagnosis not present

## 2022-11-30 DIAGNOSIS — R7303 Prediabetes: Secondary | ICD-10-CM | POA: Diagnosis not present

## 2022-11-30 DIAGNOSIS — M109 Gout, unspecified: Secondary | ICD-10-CM | POA: Diagnosis not present

## 2022-11-30 DIAGNOSIS — Z1331 Encounter for screening for depression: Secondary | ICD-10-CM | POA: Diagnosis not present

## 2022-11-30 DIAGNOSIS — Z0000 Encounter for general adult medical examination without abnormal findings: Secondary | ICD-10-CM | POA: Diagnosis not present

## 2022-11-30 DIAGNOSIS — G4700 Insomnia, unspecified: Secondary | ICD-10-CM | POA: Diagnosis not present

## 2022-12-29 ENCOUNTER — Inpatient Hospital Stay: Payer: Medicare PPO | Attending: Hematology and Oncology | Admitting: Adult Health

## 2022-12-29 ENCOUNTER — Encounter: Payer: Self-pay | Admitting: Adult Health

## 2022-12-29 VITALS — BP 116/56 | HR 91 | Temp 97.8°F | Resp 16 | Ht 65.0 in | Wt 116.6 lb

## 2022-12-29 DIAGNOSIS — C50412 Malignant neoplasm of upper-outer quadrant of left female breast: Secondary | ICD-10-CM | POA: Diagnosis not present

## 2022-12-29 DIAGNOSIS — E538 Deficiency of other specified B group vitamins: Secondary | ICD-10-CM | POA: Insufficient documentation

## 2022-12-29 DIAGNOSIS — G4720 Circadian rhythm sleep disorder, unspecified type: Secondary | ICD-10-CM | POA: Insufficient documentation

## 2022-12-29 DIAGNOSIS — F411 Generalized anxiety disorder: Secondary | ICD-10-CM | POA: Insufficient documentation

## 2022-12-29 DIAGNOSIS — Z170 Estrogen receptor positive status [ER+]: Secondary | ICD-10-CM | POA: Insufficient documentation

## 2022-12-29 DIAGNOSIS — I10 Essential (primary) hypertension: Secondary | ICD-10-CM | POA: Insufficient documentation

## 2022-12-29 DIAGNOSIS — Z13820 Encounter for screening for osteoporosis: Secondary | ICD-10-CM

## 2022-12-29 DIAGNOSIS — R053 Chronic cough: Secondary | ICD-10-CM | POA: Diagnosis not present

## 2022-12-29 DIAGNOSIS — R634 Abnormal weight loss: Secondary | ICD-10-CM | POA: Diagnosis not present

## 2022-12-29 DIAGNOSIS — Z79811 Long term (current) use of aromatase inhibitors: Secondary | ICD-10-CM | POA: Diagnosis not present

## 2022-12-29 DIAGNOSIS — Z87891 Personal history of nicotine dependence: Secondary | ICD-10-CM | POA: Diagnosis not present

## 2022-12-29 DIAGNOSIS — M109 Gout, unspecified: Secondary | ICD-10-CM | POA: Insufficient documentation

## 2022-12-29 DIAGNOSIS — E559 Vitamin D deficiency, unspecified: Secondary | ICD-10-CM | POA: Insufficient documentation

## 2022-12-29 DIAGNOSIS — F32A Depression, unspecified: Secondary | ICD-10-CM | POA: Insufficient documentation

## 2022-12-29 NOTE — Progress Notes (Signed)
SURVIVORSHIP VISIT:  BRIEF ONCOLOGIC HISTORY:  Oncology History  Malignant neoplasm of upper-outer quadrant of left breast in female, estrogen receptor positive (HCC)  06/24/2022 Initial Diagnosis   Screening mammogram detected left breast asymmetry UOQ.  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 The remaining 42 genes were Negative. Report date is 07/15/2022.  The Custom Hereditary Cancers Panel offered by [facility] includes sequencing and/or deletion duplication testing of the following 43 genes: APC, ATM, AXIN2, BAP1, BARD1, BMPR1A, BRCA1, BRCA2, BRIP1, CDH1, CDK4, CDKN2A (p14ARF and p16INK4a only), CHEK2, CTNNA1, EPCAM (Deletion/duplication testing only), FH, GREM1 (promoter region duplication testing only), HOXB13, KIT, MBD4, MEN1, MLH1, MSH2, MSH3, MSH6, MUTYH, NF1, NHTL1, PALB2, PDGFRA, PMS2, POLD1, POLE, PTEN, RAD51C, RAD51D, SMAD4, SMARCA4. STK11, TP53, TSC1, TSC2, and VHL.   09/12/2022 - 10/05/2022 Radiation Therapy   First Treatment Date: 2022-09-12 - Last Treatment Date: 2022-10-11   Plan Name: Breast_L_BH Site: Breast, Left Technique: 3D Mode: Photon Dose Per Fraction: 2.67 Gy Prescribed Dose (Delivered / Prescribed): 40.05 Gy / 40.05 Gy Prescribed Fxs (Delivered / Prescribed): 15 / 15   Plan Name: Brst_L_Bst_BH Site: Breast, Left Technique: 3D Mode: Photon Dose Per Fraction: 2 Gy Prescribed Dose (Delivered / Prescribed): 12 Gy / 12 Gy Prescribed Fxs (Delivered / Prescribed):  6 / 6   10/2022 -  Anti-estrogen oral therapy   1 mg Anastrozole daily x 5 years     INTERVAL HISTORY:  Michele Swanson to review her survivorship care plan detailing her treatment course for breast cancer, as well as monitoring long-term side effects of that treatment, education regarding health maintenance, screening, and overall wellness and health promotion.     Overall, Michele Swanson reports feeling quite well.   She reports daily stomach discomfort, which she describes as a feeling of fullness and lack of hunger. The discomfort is not immediate post-prandial but occurs 20-30 minutes after eating, varying with the type and quantity of food consumed. The patient notes that this issue has been present intermittently in the past but has become more consistent and severe recently. She has been losing some weight and is concerned that she is 116 pounds today, down from 124 pounds 3 months ago without trying to lose weight.  The patient is currently on anastrozole, amlodipine, vitamin D and B12 supplements, eszopiclone, hydrochlorothiazide, Avapro, and Mitigare for gout. She reports a recent gout flare in the left foot, managed with her prescribed medication. She also takes venlafaxine for a history of depression, which is currently in remission.  The patient has a significant smoking history, having smoked half a pack a day for 50 years, but quit four months ago. She has lost about eight pounds recently, which she attributes to a decreased appetite and small meal portions. She has a history of dry skin and has been managing it with over-the-counter products.  The patient has a positive family history of cancer and carries a gene of concern, which increases the risk of prostate cancer in men. She underwent lumpectomy  and radiation therapy for her breast cancer and has been experiencing intermittent twinges in the breast, which she attributes to the surgery. She has not had any fluid buildup or chronic pain at  the lumpectomy site.  She has been managing her hypertension with amlodipine and has been taking a vitamin D supplement daily. She also takes B12 daily due to a deficiency. She has been experiencing mild hot flashes, mostly at night, which she manages without medication.  REVIEW OF SYSTEMS:  Review of Systems  Constitutional:  Positive for appetite change and unexpected weight change. Negative for chills, fatigue and fever.  HENT:   Negative for hearing loss, lump/mass and trouble swallowing.   Eyes:  Negative for eye problems and icterus.  Respiratory:  Negative for chest tightness, cough and shortness of breath.   Cardiovascular:  Negative for chest pain, leg swelling and palpitations.  Gastrointestinal:  Positive for abdominal pain. Negative for abdominal distention, blood in stool, constipation, diarrhea, nausea, rectal pain and vomiting.  Endocrine: Negative for hot flashes.  Genitourinary:  Negative for difficulty urinating.   Musculoskeletal:  Negative for arthralgias.  Skin:  Negative for itching and rash.  Neurological:  Negative for dizziness, extremity weakness, headaches and numbness.  Hematological:  Negative for adenopathy. Does not bruise/bleed easily.  Psychiatric/Behavioral:  Negative for depression. The patient is not nervous/anxious.   Breast: Denies any new nodularity, masses, tenderness, nipple changes, or nipple discharge.    PAST MEDICAL/SURGICAL HISTORY:  Past Medical History:  Diagnosis Date   Actinic keratoses    Anxiety    Depression    Fall    History of radiation therapy    Left breast- 09/12/22-10/11/22- Dr. Antony Blackbird   Hypercholesterolemia    Hypertension    Insomnia    Low back pain    Tremor    Past Surgical History:  Procedure Laterality Date   BREAST BIOPSY Left 06/24/2022   [place] LT BREAST BX W LOC DEV 1ST LESION IMG BX SPEC US GUIDE 06/24/2022 [facility]   BREAST BIOPSY Left 06/24/2022   [place] LT BREAST BX W LOC DEV EA ADD LESION IMG BX  SPEC US GUIDE 06/24/2022 [facility]   BREAST BIOPSY  08/02/2022   MM LT RADIOACTIVE SEED LOC MAMMO GUIDE 08/02/2022 [facility]   BREAST LUMPECTOMY WITH RADIOACTIVE SEED LOCALIZATION Left 08/03/2022   Procedure: LEFT BREAST LUMPECTOMY WITH RADIOACTIVE SEED LOCALIZATION;  Surgeon: Harriette Bouillon, MD;  Location:  SUR[facility]ERY [facility];  Service: General;  Laterality: Left;   REDUCTION MAMMAPLASTY     SHOULDER SURGERY Right 10/2008   pinning     ALLERGIES:  Allergies  Allergen Reactions   Ambien [Zolpidem Tartrate] Other (See Comments)    Syncope episode   Diclofenac Other (See Comments)   Diclofenac Sodium Nausea Only   Hydrochlorothiazide W-Triamterene Other (See Comments)   Hydrocodone-Acetaminophen     Itching all over   Voltaren [Diclofenac Sodium] Nausea Only   Zolpidem     Other Reaction(s): syncope episode     CURRENT MEDICATIONS:  Outpatient Encounter Medications as of 12/29/2022  Medication Sig   amLODipine (NORVASC) 5 MG tablet Take 5 mg by mouth daily.   anastrozole (ARIMIDEX) 1 MG tablet Take 1 tablet (1 mg total) by mouth daily.   Ascorbic Acid (VITAMIN C) 100 MG tablet Take 100 mg by mouth daily.   Cholecalciferol (VITAMIN D3) 50 MCG (2000 UT) capsule Take 2,000 Units by mouth daily.   Eszopiclone (ESZOPICLONE) 3 MG TABS Take 3 mg by  mouth at bedtime. Take immediately before bedtime   hydrochlorothiazide (HYDRODIURIL) 12.5 MG tablet Take 12.5 mg by mouth daily.   irbesartan (AVAPRO) 150 MG tablet Take 150 mg by mouth daily.   MITIGARE 0.6 MG CAPS Take by mouth as needed. Take as directed   oxyCODONE (OXY IR/ROXICODONE) 5 MG immediate release tablet Take 1 tablet (5 mg total) by mouth every 6 (six) hours as needed for severe pain.   Venlafaxine HCl 75 MG TB24 Take 75 mg by mouth in the morning and at bedtime.    vitamin B-12 (CYANOCOBALAMIN) 1000 MCG tablet Take 1,000 mcg by mouth daily.   No facility-administered encounter medications on file as of  12/29/2022.     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  Tobacco Use   Smoking status: Former    Current packs/day: 0.50    Types: Cigarettes   Smokeless tobacco: Never   Tobacco comments:    10/23/15 trying to cut back  Vaping Use   Vaping status: Never Used  Substance and Sexual Activity   Alcohol use: Yes    Alcohol/week: 2.0 standard drinks of alcohol    Types: 2 Glasses of wine per week   Drug use: No   Sexual activity: Not on file  Other Topics Concern   Not on file  Social History Narrative   Caffeine - Coke, 1 daily   Right handed    Two story home   Social Determinants of Health   Financial Resource Strain: Not on file  Food Insecurity: No Food Insecurity (08/31/2022)   Hunger Vital Sign    Worried About Running Out of Food in the Last Year: Never true    Ran Out of Food in the Last Year: Never true  Transportation Needs: No Transportation Needs (08/31/2022)   PRAPARE - Administrator, Civil Service (Medical): No    Lack of Transportation (Non-Medical): No  Physical Activity: Not on file  Stress: Not on file  Social Connections: Not on file  Intimate Partner Violence: Not on file     OBSERVATIONS/OBJECTIVE:  BP (!) 116/56 (BP Location: Left Arm, Patient Position: Sitting) Comment: Nurse notified  Pulse 91   Temp 97.8 F (36.6 C) (Oral)   Resp 16   Ht 5\' 5"  (1.651 m)   Wt 116 lb 9.6 oz  (52.9 kg)   SpO2 96%   BMI 19.40 kg/m  GENERAL: Patient is a well appearing female in no acute distress HEENT:  Sclerae anicteric.  Oropharynx clear and moist. No ulcerations or evidence of oropharyngeal candidiasis. Neck is supple.  NODES:  No cervical, supraclavicular, or axillary lymphadenopathy palpated.  BREAST EXAM: Left breast status postlumpectomy and radiation no sign of local recurrence right breast is benign. LUNGS:  Clear to auscultation bilaterally.  No wheezes or  rhonchi. HEART:  Regular rate and rhythm. No murmur appreciated. ABDOMEN:  Soft, nontender.  Positive, normoactive bowel sounds. No organomegaly palpated. MSK:  No focal spinal tenderness to palpation. Full range of motion bilaterally in the upper extremities. EXTREMITIES:  No peripheral edema.   SKIN:  Clear with no obvious rashes or skin changes. No nail dyscrasia. NEURO:  Nonfocal. Well oriented.  Appropriate affect.   LABORATORY DATA:  None for this visit.  DIAGNOSTIC IMAGING:  None for this visit.      ASSESSMENT AND PLAN:  Ms.. Swanson is a pleasant 67 y.o. female with Stage IA left breast invasive ductal carcinoma, ER+/PR+/HER2-, diagnosed in 06/2022, treated with lumpectomy, adjuvant radiation therapy, and anti-estrogen therapy with Anastrozole beginning in 10/2022.  She presents to the Survivorship [facility] for our initial meeting and routine follow-up post-completion of treatment for breast cancer.    1. Stage IA left breast cancer:  Ms. Winfrey is continuing to recover from definitive treatment for breast cancer. She will follow-up with me in 12 weeks in person and we will recheck her weight with history and physical exam per surveillance protocol.  She will continue her anti-estrogen therapy with anastrozole. Her mammogram is due April 2025; orders placed today.   Today, a comprehensive survivorship care plan and treatment summary was reviewed with the patient today detailing her breast cancer diagnosis,  treatment course, potential late/long-term effects of treatment, appropriate follow-up care with recommendations for the future, and patient education resources.  A copy of this summary, along with a letter will be sent to the patient's primary care provider via mail/fax/In Basket message after today's visit.    2.  Unintentional weight loss and abdominal discomfort: This is progressive and worsening.  Will obtain CT chest abdomen pelvis to rule out etiology including the potential of metastatic breast cancer.  3. Bone health:  Given Ms. Hoppe's age/history of breast cancer and her current treatment regimen including anti-estrogen therapy with anastrozole, she is at risk for bone demineralization.  Orders placed today for bone density testing to be completed in the next few weeks at [facility].  She was given education on specific activities to promote bone health.  4. Cancer screening:  Due to Ms. Marzette's history and her age, she should receive screening for skin cancers, colon cancer, and gynecologic cancers.  The information and recommendations are listed on the patient's comprehensive care plan/treatment summary and were reviewed in detail with the patient.    5. Health maintenance and wellness promotion: Ms. Perfecto was encouraged to consume 5-7 servings of fruits and vegetables per day. We reviewed the "Nutrition Rainbow" handout.  She was also encouraged to engage in moderate to vigorous exercise for 30 minutes per day most days of the week.  She was instructed to limit her alcohol consumption and continue to abstain from tobacco use.     6. Support services/counseling: It is not uncommon for this period of the patient's cancer care trajectory to be one of many emotions and stressors.   She was given information regarding our available services and encouraged to contact me with any questions or for help enrolling in any of our support group/programs.    Follow up instructions:     -Return to cancer center in 3 to 4 weeks to discuss CT scan results -CT chest abdomen pelvis -Bone density testing -Mammogram due in April 2025 -She is welcome to return back to the Survivorship Clinic at any time; no additional follow-up needed at this time.  -  Consider referral back to survivorship as a long-term survivor for continued surveillance  The patient was provided an opportunity to ask questions and all were answered. The patient agreed with the plan and demonstrated an understanding of the instructions.   Total encounter time: 55 minutes*in face-to-face visit time, chart review, lab review, care coordination, order entry, and documentation of the encounter time.    Lillard Anes, NP 12/29/22 11:30 AM Medical Oncology and Hematology [facility] [address] [place], [place] [address] Tel. [phone]    Fax. [phone]  *Total Encounter Time as defined by the Centers for Medicare and Medicaid Services includes, in addition to the face-to-face time of a patient visit (documented in the note above) non-face-to-face time: obtaining and reviewing outside history, ordering and reviewing medications, tests or procedures, care coordination (communications with other health care professionals or caregivers) and documentation in the medical record.

## 2022-12-31 ENCOUNTER — Telehealth: Payer: Self-pay | Admitting: Adult Health

## 2022-12-31 NOTE — Telephone Encounter (Signed)
 Left patient a vm regarding upcoming appointment

## 2023-01-05 ENCOUNTER — Ambulatory Visit (HOSPITAL_BASED_OUTPATIENT_CLINIC_OR_DEPARTMENT_OTHER)
Admission: RE | Admit: 2023-01-05 | Discharge: 2023-01-05 | Disposition: A | Discharge status: Home / Self Care | Payer: Medicare PPO | Source: Ambulatory Visit | Attending: Adult Health | Admitting: Adult Health

## 2023-01-05 DIAGNOSIS — Z13820 Encounter for screening for osteoporosis: Secondary | ICD-10-CM | POA: Diagnosis not present

## 2023-01-05 DIAGNOSIS — Z780 Asymptomatic menopausal state: Secondary | ICD-10-CM | POA: Diagnosis not present

## 2023-01-05 DIAGNOSIS — Z79811 Long term (current) use of aromatase inhibitors: Secondary | ICD-10-CM | POA: Diagnosis not present

## 2023-01-05 DIAGNOSIS — M810 Age-related osteoporosis without current pathological fracture: Secondary | ICD-10-CM | POA: Diagnosis not present

## 2023-01-06 ENCOUNTER — Telehealth: Payer: Self-pay

## 2023-01-06 NOTE — Telephone Encounter (Signed)
Pt called back and was giving her bone density results per Np. I will let scheduling know to schedule her an in person appt. With [place] in the next few weeks. Pt verbalized understanding.

## 2023-01-06 NOTE — Telephone Encounter (Addendum)
Called pt per Np with message below. Received VM. Left pt a message to call back with any futher questions or concerns.----- Message from Noreene Filbert sent at 01/06/2023 11:05 AM EST ----- Please call patient and let her know that her bone density testing is consistent with osteoporosis, please let her know that I recommend treatment and would like to talk to her about this further either in person or as virtual visit over the next few weeks.  Please recommend calcium, vit d and weight bearing exercises in the meantime.     Thanks, LC ----- Message ----- From: Interface, Rad Results In Sent: 01/05/2023   2:32 PM EST To: Loa Socks, NP

## 2023-01-09 ENCOUNTER — Telehealth: Payer: Self-pay | Admitting: Adult Health

## 2023-01-09 NOTE — Telephone Encounter (Signed)
 Left patient a vm regarding upcoming appointment

## 2023-01-19 ENCOUNTER — Ambulatory Visit (HOSPITAL_COMMUNITY)
Admission: RE | Admit: 2023-01-19 | Discharge: 2023-01-19 | Disposition: A | Discharge status: Home / Self Care | Payer: Medicare PPO | Source: Ambulatory Visit | Attending: Adult Health | Admitting: Adult Health

## 2023-01-19 DIAGNOSIS — C50412 Malignant neoplasm of upper-outer quadrant of left female breast: Secondary | ICD-10-CM | POA: Diagnosis not present

## 2023-01-19 DIAGNOSIS — K5730 Diverticulosis of large intestine without perforation or abscess without bleeding: Secondary | ICD-10-CM | POA: Diagnosis not present

## 2023-01-19 DIAGNOSIS — I700 Atherosclerosis of aorta: Secondary | ICD-10-CM | POA: Diagnosis not present

## 2023-01-19 DIAGNOSIS — R634 Abnormal weight loss: Secondary | ICD-10-CM | POA: Diagnosis not present

## 2023-01-19 DIAGNOSIS — Z170 Estrogen receptor positive status [ER+]: Secondary | ICD-10-CM | POA: Diagnosis not present

## 2023-01-19 DIAGNOSIS — N200 Calculus of kidney: Secondary | ICD-10-CM | POA: Diagnosis not present

## 2023-01-19 DIAGNOSIS — R053 Chronic cough: Secondary | ICD-10-CM | POA: Insufficient documentation

## 2023-01-19 MED ORDER — IOHEXOL 300 MG/ML  SOLN
80.0000 mL | Freq: Once | INTRAMUSCULAR | Status: AC | PRN
  Administered 2023-01-19: 80 mL via INTRAVENOUS

## 2023-01-19 MED ORDER — IOHEXOL 300 MG/ML  SOLN
30.0000 mL | Freq: Once | INTRAMUSCULAR | Status: AC | PRN
  Administered 2023-01-19: 30 mL via ORAL

## 2023-01-20 ENCOUNTER — Emergency Department (HOSPITAL_BASED_OUTPATIENT_CLINIC_OR_DEPARTMENT_OTHER): Payer: Medicare PPO

## 2023-01-20 ENCOUNTER — Emergency Department (HOSPITAL_BASED_OUTPATIENT_CLINIC_OR_DEPARTMENT_OTHER)
Admission: EM | Admit: 2023-01-20 | Discharge: 2023-01-20 | Disposition: A | Discharge status: Home / Self Care | Payer: Medicare PPO | Attending: Emergency Medicine | Admitting: Emergency Medicine

## 2023-01-20 ENCOUNTER — Other Ambulatory Visit: Payer: Self-pay

## 2023-01-20 ENCOUNTER — Other Ambulatory Visit (HOSPITAL_BASED_OUTPATIENT_CLINIC_OR_DEPARTMENT_OTHER): Payer: Self-pay

## 2023-01-20 ENCOUNTER — Encounter (HOSPITAL_BASED_OUTPATIENT_CLINIC_OR_DEPARTMENT_OTHER): Payer: Self-pay | Admitting: Emergency Medicine

## 2023-01-20 DIAGNOSIS — I6529 Occlusion and stenosis of unspecified carotid artery: Secondary | ICD-10-CM | POA: Diagnosis not present

## 2023-01-20 DIAGNOSIS — K1122 Acute recurrent sialoadenitis: Secondary | ICD-10-CM | POA: Insufficient documentation

## 2023-01-20 DIAGNOSIS — K1120 Sialoadenitis, unspecified: Secondary | ICD-10-CM

## 2023-01-20 DIAGNOSIS — R221 Localized swelling, mass and lump, neck: Secondary | ICD-10-CM | POA: Diagnosis not present

## 2023-01-20 DIAGNOSIS — J432 Centrilobular emphysema: Secondary | ICD-10-CM | POA: Diagnosis not present

## 2023-01-20 DIAGNOSIS — I700 Atherosclerosis of aorta: Secondary | ICD-10-CM | POA: Diagnosis not present

## 2023-01-20 LAB — CBC WITH DIFFERENTIAL/PLATELET
Abs Immature Granulocytes: 0.01 10*3/uL (ref 0.00–0.07)
Basophils Absolute: 0 10*3/uL (ref 0.0–0.1)
Basophils Relative: 0 %
Eosinophils Absolute: 0 10*3/uL (ref 0.0–0.5)
Eosinophils Relative: 0 %
HCT: 42.9 % (ref 36.0–46.0)
Hemoglobin: 14.5 g/dL (ref 12.0–15.0)
Immature Granulocytes: 0 %
Lymphocytes Relative: 12 %
Lymphs Abs: 1 10*3/uL (ref 0.7–4.0)
MCH: 37.6 pg — ABNORMAL HIGH (ref 26.0–34.0)
MCHC: 33.8 g/dL (ref 30.0–36.0)
MCV: 111.1 fL — ABNORMAL HIGH (ref 80.0–100.0)
Monocytes Absolute: 0.6 10*3/uL (ref 0.1–1.0)
Monocytes Relative: 7 %
Neutro Abs: 6.9 10*3/uL (ref 1.7–7.7)
Neutrophils Relative %: 81 %
Platelets: 310 10*3/uL (ref 150–400)
RBC: 3.86 MIL/uL — ABNORMAL LOW (ref 3.87–5.11)
RDW: 14.7 % (ref 11.5–15.5)
WBC: 8.5 10*3/uL (ref 4.0–10.5)
nRBC: 0 % (ref 0.0–0.2)

## 2023-01-20 LAB — BASIC METABOLIC PANEL
Anion gap: 8 (ref 5–15)
BUN: 13 mg/dL (ref 8–23)
CO2: 30 mmol/L (ref 22–32)
Calcium: 10 mg/dL (ref 8.9–10.3)
Chloride: 98 mmol/L (ref 98–111)
Creatinine, Ser: 0.98 mg/dL (ref 0.44–1.00)
GFR, Estimated: >60 mL/min (ref >60)
Glucose, Bld: 148 mg/dL — ABNORMAL HIGH (ref 70–99)
Potassium: 4.6 mmol/L (ref 3.5–5.1)
Sodium: 136 mmol/L (ref 135–145)

## 2023-01-20 MED ORDER — IOHEXOL 300 MG/ML  SOLN
80.0000 mL | Freq: Once | INTRAMUSCULAR | Status: AC | PRN
  Administered 2023-01-20: 80 mL via INTRAVENOUS

## 2023-01-20 MED ORDER — AMOXICILLIN-POT CLAVULANATE 875-125 MG PO TABS
1.0000 | ORAL_TABLET | Freq: Two times a day (BID) | ORAL | 0 refills | Status: DC
  Filled 2023-01-20: qty 14, 7d supply, fill #0

## 2023-01-20 NOTE — ED Provider Notes (Signed)
[facility] AT [facility] ([facility]) Provider Note   CSN: [id number] Arrival date & time: 01/20/23  5621     History  Chief Complaint  Patient presents with   Oral Swelling    Michele Swanson is a 67 y.o. female.  67 yo F with a chief complaints of swelling to the her neck.  She said that she woke up this morning and felt like the area was significantly swollen.  Session gotten better throughout the morning.  She has some pain with swallowing.  Denied any issues yesterday.  She had a CT scan done yesterday for her unintentional weight loss and night sweats that was negative for obvious acute pathology.  She had no symptoms immediately after CT imaging.        Home Medications Prior to Admission medications   Medication Sig Start Date End Date Taking? Authorizing Provider  amoxicillin-clavulanate (AUGMENTIN) 875-125 MG tablet Take 1 tablet by mouth every 12 (twelve) hours. 01/20/23  Yes Melene Plan, DO  amLODipine (NORVASC) 5 MG tablet Take 5 mg by mouth daily. 12/31/18   [provider]  anastrozole (ARIMIDEX) 1 MG tablet Take 1 tablet (1 mg total) by mouth daily. 10/11/22   [name], MD  Ascorbic Acid (VITAMIN C) 100 MG tablet Take 100 mg by mouth daily.    [provider]  Cholecalciferol (VITAMIN D3) 50 MCG (2000 UT) capsule Take 2,000 Units by mouth daily.    [provider]  Eszopiclone (ESZOPICLONE) 3 MG TABS Take 3 mg by mouth at bedtime. Take immediately before bedtime    [provider]  hydrochlorothiazide (HYDRODIURIL) 12.5 MG tablet Take 12.5 mg by mouth daily. 12/31/18   [provider]  irbesartan (AVAPRO) 150 MG tablet Take 150 mg by mouth daily.    [provider]  MITIGARE 0.6 MG CAPS Take by mouth as needed. Take as directed 09/25/19   [provider]  Venlafaxine HCl 75 MG TB24 Take 75 mg by mouth in the morning and at bedtime.     [provider]  vitamin B-12  (CYANOCOBALAMIN) 1000 MCG tablet Take 1,000 mcg by mouth daily.    [provider]      Allergies    Ambien [zolpidem tartrate], Diclofenac, Diclofenac sodium, Hydrochlorothiazide w-triamterene, Hydrocodone-acetaminophen, Voltaren [diclofenac sodium], and Zolpidem    Review of Systems   Review of Systems  Physical Exam Updated Vital Signs BP 115/64   Pulse 86   Temp 97.9 F (36.6 C)   Resp 16   Wt 52.6 kg   SpO2 94%   BMI 19.30 kg/m  Physical Exam Vitals and nursing note reviewed.  Constitutional:      General: She is not in acute distress.    Appearance: She is well-developed. She is not diaphoretic.  HENT:     Head: Normocephalic and atraumatic.     Mouth/Throat:     Comments: Some mild fullness to the under aspect of the jaw.  She has a denture in place for her lower teeth.  She has no obvious sublingual swelling.  Tolerating secretions without difficulty able to rotate her head without discomfort. Eyes:     Pupils: Pupils are equal, round, and reactive to light.  Cardiovascular:     Rate and Rhythm: Normal rate and regular rhythm.     Heart sounds: No murmur heard.    No friction rub. No gallop.  Pulmonary:     Effort: Pulmonary effort is normal.  Breath sounds: No wheezing or rales.  Abdominal:     General: There is no distension.     Palpations: Abdomen is soft.     Tenderness: There is no abdominal tenderness.  Musculoskeletal:        General: No tenderness.     Cervical back: Normal range of motion and neck supple.  Skin:    General: Skin is warm and dry.  Neurological:     Mental Status: She is alert and oriented to person, place, and time.  Psychiatric:        Behavior: Behavior normal.     ED Results / Procedures / Treatments   Labs (all labs ordered are listed, but only abnormal results are displayed) Labs Reviewed  CBC WITH DIFFERENTIAL/PLATELET - Abnormal; Notable for the following components:      Result Value   RBC 3.86 (*)     MCV 111.1 (*)    MCH 37.6 (*)    All other components within normal limits  BASIC METABOLIC PANEL - Abnormal; Notable for the following components:   Glucose, Bld 148 (*)    All other components within normal limits    EKG None  Radiology CT Soft Tissue Neck W Contrast  Result Date: 01/20/2023 CLINICAL DATA:  Throat swelling after a contrasted CT yesterday EXAM: CT NECK WITH CONTRAST TECHNIQUE: Multidetector CT imaging of the neck was performed using the standard protocol following the bolus administration of intravenous contrast. RADIATION DOSE REDUCTION: This exam was performed according to the departmental dose-optimization program which includes automated exposure control, adjustment of the mA and/or kV according to patient size and/or use of iterative reconstruction technique. CONTRAST:  80mL OMNIPAQUE IOHEXOL 300 MG/ML  SOLN COMPARISON:  None Available. FINDINGS: Pharynx and larynx: Normal. No mass or swelling. Salivary glands: Heterogeneous hyperenhancement of the right greater than left submandibular glands, with adjacent fat stranding. The parotid glands are unremarkable. No evidence of mass or stone. Thyroid: Normal. Lymph nodes: None enlarged or abnormal density. Vascular: Aortic and carotid atherosclerotic calcifications. Otherwise patent. Limited intracranial: Negative. Visualized orbits: Negative. Mastoids and visualized paranasal sinuses: Clear. Skeleton: No acute osseous abnormality. Upper chest: No focal pulmonary opacity or pleural effusion. Centrilobular emphysema. Other: Fat stranding in the anterior neck and submandibular soft tissues. No skin thickening or hyperenhancement. No focal collection. IMPRESSION: 1. Heterogeneous hyperenhancement of the right greater than left submandibular glands, with adjacent fat stranding, concerning for sialoadenitis. No evidence of sialolithiasis. 2. Fat stranding in the anterior neck and submandibular soft tissues, which may be reactive or  represent cellulitis. No focal collection. 3. No evidence of pharyngeal or laryngeal edema. 4. Aortic atherosclerosis (ICD10-I70.0) and Emphysema (ICD10-J43.9). Electronically Signed   By: Wiliam Ke M.D.   On: 01/20/2023 11:58   CT CHEST ABDOMEN PELVIS W CONTRAST  Result Date: 01/19/2023 CLINICAL DATA:  Unintentional weight loss, left breast cancer. * Tracking Code: BO *. EXAM: CT CHEST, ABDOMEN, AND PELVIS WITH CONTRAST TECHNIQUE: Multidetector CT imaging of the chest, abdomen and pelvis was performed following the standard protocol during bolus administration of intravenous contrast. RADIATION DOSE REDUCTION: This exam was performed according to the departmental dose-optimization program which includes automated exposure control, adjustment of the mA and/or kV according to patient size and/or use of iterative reconstruction technique. CONTRAST:  80mL OMNIPAQUE IOHEXOL 300 MG/ML  SOLN COMPARISON:  Multiple priors including CT June 11, 2019. FINDINGS: CT CHEST FINDINGS Cardiovascular: Aortic atherosclerosis. No central pulmonary embolus on this nondedicated study. Normal size heart. No significant pericardial  effusion/thickening. Mediastinum/Nodes: No suspicious thyroid nodule. No pathologically enlarged mediastinal, hilar or axillary lymph nodes. The esophagus is grossly unremarkable. Lungs/Pleura: No suspicious pulmonary nodules or masses. No pleural effusion. No pneumothorax. Musculoskeletal: No aggressive lytic or blastic lesion of bone. Age indeterminate mild T11 and moderate T12 superior endplate compression deformities. Remote healed right-sided rib fractures. Surgical clips and mild soft tissue stranding in the lateral left breast on image 22/2 is favored postsurgical. CT ABDOMEN PELVIS FINDINGS Hepatobiliary: No suspicious hepatic lesion. Gallbladder is unremarkable. No biliary ductal dilation. Pancreas: No pancreatic ductal dilation or evidence of acute inflammation. Spleen: No splenomegaly.  Adrenals/Urinary Tract: Bilateral adrenal thickening without discrete nodularity is favored hyperplasia. No hydronephrosis. Kidneys demonstrate symmetric enhancement. Nonobstructive 2-3 mm left upper pole renal stone. Urinary bladder is unremarkable for degree of distension. Stomach/Bowel: Radiopaque enteric contrast material traverses the splenic flexure. Stomach is unremarkable for minimal distension. No pathologic dilation of small or large bowel. Normal appendix. Moderate volume of formed stool in the colon. Sigmoid colonic diverticulosis without findings of acute diverticulitis. Vascular/Lymphatic: Aortic atherosclerosis. Smooth IVC contours. The portal, splenic and superior mesenteric veins are patent. No pathologically enlarged abdominal or pelvic lymph nodes. Reproductive: Status post hysterectomy. No adnexal masses. Other: No significant abdominopelvic free fluid. No discrete peritoneal or omental nodularity. Musculoskeletal: No aggressive lytic or blastic lesion of bone. Diffuse demineralization of bone. Sclerotic focus in the L3 vertebral body on image 90/5 is stable dating back to June 11, 2019 compatible with a benign finding. IMPRESSION: 1. No convincing evidence of metastatic disease in the chest, abdomen or pelvis. 2. Age indeterminate mild T11 and moderate T12 superior endplate compression deformities. Correlate with point tenderness. 3. Sigmoid colonic diverticulosis without findings of acute diverticulitis. 4. Nonobstructive 2-3 mm left upper pole renal stone. 5.  Aortic Atherosclerosis (ICD10-I70.0). Electronically Signed   By: Maudry Mayhew M.D.   On: 01/19/2023 15:08    Procedures Procedures    Medications Ordered in ED Medications  iohexol (OMNIPAQUE) 300 MG/ML solution 80 mL (80 mLs Intravenous Contrast Given 01/20/23 1055)    ED Course/ Medical Decision Making/ A&P                                 Medical Decision Making Amount and/or Complexity of Data Reviewed Labs:  ordered. Radiology: ordered.  Risk Prescription drug management.   67 yo F with a chief complaints of swelling to her neck.  She tells me that this is an acute problem.  Will obtain CT imaging to assess for possible Ludwig's or retropharyngeal abscess though seems unlikely based on presentation.  CT imaging with sialoadenitis without signs of blockage.  I am not sure of the exact cause of this.  I think is unlikely to be viral or bacterial.  Also think is unlikely to have bilateral obstruction.  I am going to start her on antibiotics.  Have her follow-up with her family doctor in the office.  12:06 PM:  I have discussed the diagnosis/risks/treatment options with the patient.  Evaluation and diagnostic testing in the emergency department does not suggest an emergent condition requiring admission or immediate intervention beyond what has been performed at this time.  They will follow up with PCP. We also discussed returning to the ED immediately if new or worsening sx occur. We discussed the sx which are most concerning (e.g., sudden worsening pain, fever, inability to tolerate by mouth, rapid swelling, difficulty breathing or swallowing)  that necessitate immediate return. Medications administered to the patient during their visit and any new prescriptions provided to the patient are listed below.  Medications given during this visit Medications  iohexol (OMNIPAQUE) 300 MG/ML solution 80 mL (80 mLs Intravenous Contrast Given 01/20/23 1055)     The patient appears reasonably screen and/or stabilized for discharge and I doubt any other medical condition or other [facility] requiring further screening, evaluation, or treatment in the ED at this time prior to discharge.         Final Clinical Impression(s) / ED Diagnoses Final diagnoses:  Sialoadenitis    Rx / DC Orders ED Discharge Orders          Ordered    amoxicillin-clavulanate (AUGMENTIN) 875-125 MG tablet  Every 12 hours        01/20/23  1205              Melene Plan, DO 01/20/23 1206

## 2023-01-20 NOTE — Discharge Instructions (Signed)
The CT scan shows enlargement of your salivary glands bilaterally.  There are many things that can cause this.  I am going to treat you like it may be caused by a bacteria.  I have started you on antibiotics.  Please return for rapid worsening difficulty breathing or swallowing.  Please follow-up with your family doctor in the office.

## 2023-01-20 NOTE — ED Notes (Signed)

## 2023-01-20 NOTE — ED Notes (Signed)
Charting correction r/t animal bite notation in pt chart

## 2023-01-20 NOTE — ED Triage Notes (Signed)
Pt c/o throat swelling this morning upon waking at 0800. Reports having CT with contrast yesterday afternoon, endorses concern for allergic reaction. Denies rash. Endorses ability to swallow secretions. Also states "feels like my tongue is swollen"

## 2023-01-23 ENCOUNTER — Telehealth: Payer: Self-pay

## 2023-01-23 NOTE — Telephone Encounter (Signed)
Pt called and reports she has mumps. She will need to r/s her appt. She will f.u as scheduled with LC in Feb.

## 2023-01-26 ENCOUNTER — Ambulatory Visit: Payer: Medicare PPO | Admitting: Adult Health

## 2023-01-31 ENCOUNTER — Telehealth: Payer: Self-pay

## 2023-01-31 NOTE — Telephone Encounter (Addendum)
Called pt per message below. Pt states she will call scheduling to schedule a telephone follow-up with Mardella Layman. Pt verbalized understanding.----- Message from Noreene Filbert sent at 01/31/2023  8:58 AM EST ----- This patient called yesterday.  Please let me know if she has any questions and we can set up a phone visit.  Thanks, LC ----- Message ----- From: Dellis Filbert, LPN Sent: 13/24/4010   4:11 PM EST To: [facility]  Called pt. Advised to call Monday if not by 4:30 pm. Communicated message below. ----- Message ----- From: Loa Socks, NP Sent: 01/27/2023   3:44 PM EST To: [facility]  I waqs supposed to review scans with patient last week but she had "mumps".  Can someone call and ask if she has any questions about her CT chest? She isn't scheduled to see me for 2 months time. (This is non urgent FYI)  Thanks, LC ----- Message ----- From: Interface, Rad Results In Sent: 01/19/2023   3:11 PM EST To: Loa Socks, NP

## 2023-02-17 DIAGNOSIS — E559 Vitamin D deficiency, unspecified: Secondary | ICD-10-CM | POA: Diagnosis not present

## 2023-03-27 ENCOUNTER — Inpatient Hospital Stay: Payer: Medicare PPO | Attending: Hematology and Oncology | Admitting: Adult Health

## 2023-03-27 ENCOUNTER — Encounter: Payer: Self-pay | Admitting: Adult Health

## 2023-03-27 VITALS — BP 122/75 | HR 88 | Temp 97.7°F | Resp 18 | Ht 65.0 in | Wt 117.2 lb

## 2023-03-27 DIAGNOSIS — R42 Dizziness and giddiness: Secondary | ICD-10-CM | POA: Diagnosis not present

## 2023-03-27 DIAGNOSIS — C50412 Malignant neoplasm of upper-outer quadrant of left female breast: Secondary | ICD-10-CM | POA: Insufficient documentation

## 2023-03-27 DIAGNOSIS — M810 Age-related osteoporosis without current pathological fracture: Secondary | ICD-10-CM | POA: Insufficient documentation

## 2023-03-27 DIAGNOSIS — R053 Chronic cough: Secondary | ICD-10-CM | POA: Diagnosis not present

## 2023-03-27 DIAGNOSIS — R052 Subacute cough: Secondary | ICD-10-CM

## 2023-03-27 DIAGNOSIS — Z720 Tobacco use: Secondary | ICD-10-CM | POA: Diagnosis not present

## 2023-03-27 DIAGNOSIS — M549 Dorsalgia, unspecified: Secondary | ICD-10-CM | POA: Diagnosis not present

## 2023-03-27 DIAGNOSIS — Z170 Estrogen receptor positive status [ER+]: Secondary | ICD-10-CM | POA: Insufficient documentation

## 2023-03-27 MED ORDER — ALBUTEROL SULFATE HFA 108 (90 BASE) MCG/ACT IN AERS: 2.0000 | INHALATION_SPRAY | Freq: Four times a day (QID) | RESPIRATORY_TRACT | 0 refills | Status: AC | PRN

## 2023-03-27 NOTE — Assessment & Plan Note (Signed)
 06/24/2022:Screening mammogram detected left breast asymmetry UOQ.  Two abnormalities, 2 o'clock position 5 mm biopsy: Grade 1 IDC with DCIS ER 100%, PR 100%, HER2 negative 1+, Ki-67 5%; additional area 9 mm biopsy: Benign, axilla negative    08/03/2022: Left lumpectomy: Grade 1 IDC 0.8 cm, low-grade DCIS, LVI not present, margins negative, ER 100%, PR 100%, HER2 1+ negative, Ki-67 5% Did not recommend Oncotype DX testing for grade 1 tumor less than 1 cm.  Adjuvant radiation: 09/13/2022-10/11/2022 Treatment plan: Adjuvant antiestrogen therapy with anastrozole  1 mg daily x 5 years  Breast Cancer No evidence of metastasis on recent imaging. Patient reports intermittent breast pain, common in breast cancer survivors. No concerning findings on physical examination. -Continue Anastrozole . -Schedule mammogram in April 2025.  Osteoporosis Patient on high dose Vitamin D therapy. -Continue Vitamin D therapy as directed by primary care provider. -Unable to start bisphosphanate until cleared by dentist  Chronic Cough Patient reports persistent chest congestion and cough. No improvement with Mucinex. -Prescribe inhaler to help open airways and alleviate cough. -follow-up with PCP for further evaluation and management if continues  Vertigo Patient reports episodes of dizziness and imbalance. -Recommend follow-up with primary care provider to evaluate these symptoms.  Back Pain Imaging showed areas of possible compression in the spine. Patient reports positional discomfort and poor sleep. -follow up with PCP for further evaluation and management if it continues  General Health Maintenance -Return for follow-up in six months with Dr. Gudina to monitor response to Anastrozole .

## 2023-03-27 NOTE — Progress Notes (Signed)
  Cancer Center Cancer Follow up:    Michele Hood, MD [phone] [address] [place] [address]   DIAGNOSIS:  Cancer Staging  Malignant neoplasm of upper-outer quadrant of left breast in female, estrogen receptor positive (HCC) Staging form: Breast, AJCC 8th Edition - Clinical stage from 07/06/2022: Stage IA (cT1a, cN0, cM0, G1, ER+, PR+, HER2-) - Signed by Cameron Cea, MD on 07/06/2022 Stage prefix: Initial diagnosis Histologic grading system: 3 grade system - Pathologic stage from 08/03/2022: Stage IA (pT1b, pN0, cM0, G1, ER+, PR+, HER2-) - Signed by Percival Brace, NP on 12/29/2022 Stage prefix: Initial diagnosis Histologic grading system: 3 grade system   SUMMARY OF ONCOLOGIC HISTORY: Oncology History  Malignant neoplasm of upper-outer quadrant of left breast in female, estrogen receptor positive (HCC)  06/24/2022 Initial Diagnosis   Screening mammogram detected left breast asymmetry UOQ.  Two abnormalities, 2 o'clock position 5 mm biopsy: Grade 1 IDC with DCIS ER 100%, PR 100%, HER2 negative 1+, Ki-67 5%; additional area 9 mm biopsy: Benign, axilla negative   07/06/2022 Cancer Staging   Staging form: Breast, AJCC 8th Edition - Clinical stage from 07/06/2022: Stage IA (cT1a, cN0, cM0, G1, ER+, PR+, HER2-) - Signed by Cameron Cea, MD on 07/06/2022 Stage prefix: Initial diagnosis Histologic grading system: 3 grade system    Genetic Testing   [facility] Custom Panel+RNA identified an increased risk allele in the HOXB13 gene (c.251G>A). The remaining 42 genes were Negative. Report date is 07/15/2022.  The Custom Hereditary Cancers Panel offered by [facility] includes sequencing and/or deletion duplication testing of the following 43 genes: APC, ATM, AXIN2, BAP1, BARD1, BMPR1A, BRCA1, BRCA2, BRIP1, CDH1, CDK4, CDKN2A (p14ARF and p16INK4a only), CHEK2, CTNNA1, EPCAM (Deletion/duplication testing only), FH, GREM1 (promoter region duplication testing only), HOXB13,  KIT, MBD4, MEN1, MLH1, MSH2, MSH3, MSH6, MUTYH, NF1, NHTL1, PALB2, PDGFRA, PMS2, POLD1, POLE, PTEN, RAD51C, RAD51D, SMAD4, SMARCA4. STK11, TP53, TSC1, TSC2, and VHL.   08/03/2022 Oncotype testing   Left lumpectomy: Grade 1 IDC 0.8 cm, low-grade DCIS, LVI not present, margins negative, ER 100%, PR 100%, HER2 1+ negative, Ki-67 5% Did not recommend Oncotype DX testing for grade 1 tumor less than 1 cm.   08/03/2022 Cancer Staging   Staging form: Breast, AJCC 8th Edition - Pathologic stage from 08/03/2022: Stage IA (pT1b, pN0, cM0, G1, ER+, PR+, HER2-) - Signed by Percival Brace, NP on 12/29/2022 Stage prefix: Initial diagnosis Histologic grading system: 3 grade system   09/12/2022 - 10/05/2022 Radiation Therapy   First Treatment Date: 2022-09-12 - Last Treatment Date: 2022-10-11   Plan Name: Breast_L_BH Site: Breast, Left Technique: 3D Mode: Photon Dose Per Fraction: 2.67 Gy Prescribed Dose (Delivered / Prescribed): 40.05 Gy / 40.05 Gy Prescribed Fxs (Delivered / Prescribed): 15 / 15   Plan Name: Brst_L_Bst_BH Site: Breast, Left Technique: 3D Mode: Photon Dose Per Fraction: 2 Gy Prescribed Dose (Delivered / Prescribed): 12 Gy / 12 Gy Prescribed Fxs (Delivered / Prescribed): 6 / 6   10/2022 -  Anti-estrogen oral therapy   1 mg Anastrozole  daily x 5 years     CURRENT THERAPY: Anastrozole   INTERVAL HISTORY:   Discussed the use of AI scribe software for clinical note transcription with the patient, who gave verbal consent to proceed.  Michele Swanson 68 y.o. female with a history of breast cancer, osteoporosis, and high blood pressure, presents with ongoing chest congestion and recent onset vertigo. She reports a recent episode of mumps, which required a four-day home stay  and disrupted her schedule. She also reports a history of weight changes and appetite loss, which prompted imaging studies. The imaging studies did not reveal any metastatic cancer but did show some areas of  spinal compression. The patient reports occasional back pain, which she manages by adjusting her sleeping position.  The patient also reports a throbbing pain in the area of her previous breast cancer, which she describes as sometimes extending under her arm. She has been managing her osteoporosis with Vitamin D3 and has recently been prescribed a high-dose Vitamin D regimen by her primary care provider. She also reports difficulty retaining B12 and has received B12 shots in the past.  The patient has been experiencing chest congestion, which she believes may be due to allergies. She has tried Mucinex, but reports that it has not been effective. She also reports a recent episode of vertigo, which she describes as a sensation of the room spinning and associated with a headache. She has not yet sought treatment for this symptom.   Patient Active Problem List   Diagnosis Date Noted   Hypertension 12/29/2022   Vitamin D deficiency 12/29/2022   B12 deficiency 12/29/2022   Sleep pattern disturbance 12/29/2022   Gout 12/29/2022   GAD (generalized anxiety disorder) 12/29/2022   Depression 12/29/2022   Genetic testing 07/19/2022   Malignant neoplasm of upper-outer quadrant of left breast in female, estrogen receptor positive (HCC) 07/04/2022   Tremor 10/23/2015    is allergic to ambien [zolpidem tartrate], diclofenac, diclofenac sodium, hydrochlorothiazide w-triamterene, hydrocodone-acetaminophen , voltaren [diclofenac sodium], and zolpidem.  MEDICAL HISTORY: Past Medical History:  Diagnosis Date   Actinic keratoses    Anxiety    Depression    Fall    History of radiation therapy    Left breast- 09/12/22-10/11/22- Dr. Retta Caster   Hypercholesterolemia    Hypertension    Insomnia    Low back pain    Tremor     SURGICAL HISTORY: Past Surgical History:  Procedure Laterality Date   BREAST BIOPSY Left 06/24/2022   US  LT BREAST BX W LOC DEV 1ST LESION IMG BX SPEC US  GUIDE 06/24/2022 [facility]  MAMMOGRAPHY   BREAST BIOPSY Left 06/24/2022   US  LT BREAST BX W LOC DEV EA ADD LESION IMG BX SPEC US  GUIDE 06/24/2022 [facility] MAMMOGRAPHY   BREAST BIOPSY  08/02/2022   MM LT RADIOACTIVE SEED LOC MAMMO GUIDE 08/02/2022 [facility] MAMMOGRAPHY   BREAST LUMPECTOMY WITH RADIOACTIVE SEED LOCALIZATION Left 08/03/2022   Procedure: LEFT BREAST LUMPECTOMY WITH RADIOACTIVE SEED LOCALIZATION;  Surgeon: Sim Dryer, MD;  Location: [facility];  Service: General;  Laterality: Left;   REDUCTION MAMMAPLASTY     SHOULDER SURGERY Right 10/2008   pinning    SOCIAL HISTORY: Social History   Socioeconomic History   Marital status: Married    Spouse name: Not on file   Number of children: 0   Years of education: 12   Highest education level: Not on file  Occupational History    Comment: retired, 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  Tobacco Use   Smoking status: Some Days    Current packs/day: 0.50    Types: Cigarettes   Smokeless tobacco: Never   Tobacco comments:    10/23/15 trying to cut back  Vaping Use   Vaping status: Never Used  Substance and Sexual Activity   Alcohol use: Yes    Alcohol/week: 2.0 standard drinks of alcohol    Types: 2 Glasses of wine per week   Drug use: No  Sexual activity: Not on file  Other Topics Concern   Not on file  Social History Narrative   Caffeine - Coke, 1 daily   Right handed    Two story home   Social Drivers of Health   Financial Resource Strain: Not on file  Food Insecurity: No Food Insecurity (08/31/2022)   Hunger Vital Sign    Worried About Running Out of Food in the Last Year: Never true    Ran Out of Food in the Last Year: Never true  Transportation Needs: No Transportation Needs (08/31/2022)   PRAPARE - Administrator, Civil Service (Medical): No    Lack of Transportation (Non-Medical): No  Physical Activity: Not on file  Stress: Not on file  Social Connections: Not on file  Intimate Partner Violence: Not on file    FAMILY  HISTORY: Family History  Problem Relation Age of Onset   Dementia Mother    Heart disease Father    Atrial fibrillation Brother    Breast cancer Maternal Aunt 70 - 89   Colon cancer Maternal Uncle    Heart disease Paternal Grandmother    Ovarian cancer Cousin        possibly uterine, maternal first cousin    Review of Systems  Constitutional:  Negative for appetite change, chills, fatigue, fever and unexpected weight change.  HENT:   Negative for hearing loss, lump/mass, mouth sores and trouble swallowing.   Eyes:  Negative for eye problems and icterus.  Respiratory:  Positive for cough. Negative for chest tightness and shortness of breath.   Cardiovascular:  Negative for chest pain, leg swelling and palpitations.  Gastrointestinal:  Negative for abdominal distention, abdominal pain, constipation, diarrhea, nausea and vomiting.  Endocrine: Negative for hot flashes.  Genitourinary:  Negative for difficulty urinating.   Musculoskeletal:  Negative for arthralgias.  Skin:  Negative for itching and rash.  Neurological:  Positive for dizziness. Negative for extremity weakness, headaches, light-headedness and numbness.  Hematological:  Negative for adenopathy. Does not bruise/bleed easily.  Psychiatric/Behavioral:  Negative for depression. The patient is not nervous/anxious.       PHYSICAL EXAMINATION    Vitals:   03/27/23 1004  BP: 122/75  Pulse: 88  Resp: 18  Temp: 97.7 F (36.5 C)  SpO2: 92%    Physical Exam Constitutional:      General: She is not in acute distress.    Appearance: Normal appearance. She is not toxic-appearing.  HENT:     Head: Normocephalic and atraumatic.     Mouth/Throat:     Mouth: Mucous membranes are moist.     Pharynx: Oropharynx is clear. No oropharyngeal exudate or posterior oropharyngeal erythema.  Eyes:     General: No scleral icterus. Cardiovascular:     Rate and Rhythm: Normal rate and regular rhythm.     Pulses: Normal pulses.      Heart sounds: Normal heart sounds.  Pulmonary:     Effort: Pulmonary effort is normal.     Breath sounds: Wheezing (occ exp. wheeze) present.  Chest:     Comments: Right breast benign, left breast s/p lumpectomy and radiation, no sign of local recurrence.  Abdominal:     General: Abdomen is flat. Bowel sounds are normal. There is no distension.     Palpations: Abdomen is soft.     Tenderness: There is no abdominal tenderness.  Musculoskeletal:        General: No swelling.     Cervical back: Neck supple.  Lymphadenopathy:     Cervical: No cervical adenopathy.     Upper Body:     Right upper body: No axillary adenopathy.     Left upper body: No axillary adenopathy.  Skin:    General: Skin is warm and dry.     Findings: No rash.  Neurological:     General: No focal deficit present.     Mental Status: She is alert.  Psychiatric:        Mood and Affect: Mood normal.        Behavior: Behavior normal.         ASSESSMENT and THERAPY PLAN:   Malignant neoplasm of upper-outer quadrant of left breast in female, estrogen receptor positive (HCC) 06/24/2022:Screening mammogram detected left breast asymmetry UOQ.  Two abnormalities, 2 o'clock position 5 mm biopsy: Grade 1 IDC with DCIS ER 100%, PR 100%, HER2 negative 1+, Ki-67 5%; additional area 9 mm biopsy: Benign, axilla negative    08/03/2022: Left lumpectomy: Grade 1 IDC 0.8 cm, low-grade DCIS, LVI not present, margins negative, ER 100%, PR 100%, HER2 1+ negative, Ki-67 5% Did not recommend Oncotype DX testing for grade 1 tumor less than 1 cm.  Adjuvant radiation: 09/13/2022-10/11/2022 Treatment plan: Adjuvant antiestrogen therapy with anastrozole  1 mg daily x 5 years  Breast Cancer No evidence of metastasis on recent imaging. Patient reports intermittent breast pain, common in breast cancer survivors. No concerning findings on physical examination. -Continue Anastrozole . -Schedule mammogram in April 2025.  Osteoporosis Patient  on high dose Vitamin D therapy. -Continue Vitamin D therapy as directed by primary care provider. -Unable to start bisphosphanate until cleared by dentist  Chronic Cough Patient reports persistent chest congestion and cough. No improvement with Mucinex. -Prescribe inhaler to help open airways and alleviate cough. -follow-up with PCP for further evaluation and management if continues  Vertigo Patient reports episodes of dizziness and imbalance. -Recommend follow-up with primary care provider to evaluate these symptoms.  Back Pain Imaging showed areas of possible compression in the spine. Patient reports positional discomfort and poor sleep. -follow up with PCP for further evaluation and management if it continues  General Health Maintenance -Return for follow-up in six months with Dr. Gudina to monitor response to Anastrozole .  All questions were answered. The patient knows to call the clinic with any problems, questions or concerns. We can certainly see the patient much sooner if necessary.  Total encounter time:20 minutes*in face-to-face visit time, chart review, lab review, care coordination, order entry, and documentation of the encounter time.    Alwin Baars, NP 03/27/23 12:10 PM Medical Oncology and Hematology [facility] [address] [place], [place] [address] Tel. [phone]    Fax. [phone]  *Total Encounter Time as defined by the Centers for Medicare and Medicaid Services includes, in addition to the face-to-face time of a patient visit (documented in the note above) non-face-to-face time: obtaining and reviewing outside history, ordering and reviewing medications, tests or procedures, care coordination (communications with other health care professionals or caregivers) and documentation in the medical record.

## 2023-04-17 DIAGNOSIS — E559 Vitamin D deficiency, unspecified: Secondary | ICD-10-CM | POA: Diagnosis not present

## 2023-05-31 ENCOUNTER — Other Ambulatory Visit: Payer: Self-pay | Admitting: Family Medicine

## 2023-05-31 DIAGNOSIS — F331 Major depressive disorder, recurrent, moderate: Secondary | ICD-10-CM | POA: Diagnosis not present

## 2023-05-31 DIAGNOSIS — G4700 Insomnia, unspecified: Secondary | ICD-10-CM | POA: Diagnosis not present

## 2023-05-31 DIAGNOSIS — I10 Essential (primary) hypertension: Secondary | ICD-10-CM | POA: Diagnosis not present

## 2023-05-31 DIAGNOSIS — C50912 Malignant neoplasm of unspecified site of left female breast: Secondary | ICD-10-CM | POA: Diagnosis not present

## 2023-05-31 DIAGNOSIS — R7303 Prediabetes: Secondary | ICD-10-CM | POA: Diagnosis not present

## 2023-05-31 DIAGNOSIS — J441 Chronic obstructive pulmonary disease with (acute) exacerbation: Secondary | ICD-10-CM | POA: Diagnosis not present

## 2023-05-31 DIAGNOSIS — M109 Gout, unspecified: Secondary | ICD-10-CM | POA: Diagnosis not present

## 2023-05-31 DIAGNOSIS — Z13820 Encounter for screening for osteoporosis: Secondary | ICD-10-CM | POA: Diagnosis not present

## 2023-05-31 DIAGNOSIS — E2839 Other primary ovarian failure: Secondary | ICD-10-CM

## 2023-05-31 DIAGNOSIS — E782 Mixed hyperlipidemia: Secondary | ICD-10-CM | POA: Diagnosis not present

## 2023-06-05 ENCOUNTER — Other Ambulatory Visit: Payer: Self-pay | Admitting: Adult Health

## 2023-06-05 ENCOUNTER — Ambulatory Visit
Admission: RE | Admit: 2023-06-05 | Discharge: 2023-06-05 | Disposition: A | Discharge status: Home / Self Care | Payer: Medicare PPO | Source: Ambulatory Visit | Attending: Adult Health | Admitting: Adult Health

## 2023-06-05 ENCOUNTER — Ambulatory Visit
Admission: RE | Admit: 2023-06-05 | Discharge: 2023-06-05 | Disposition: A | Discharge status: Home / Self Care | Source: Ambulatory Visit | Attending: Adult Health | Admitting: Adult Health

## 2023-06-05 DIAGNOSIS — N644 Mastodynia: Secondary | ICD-10-CM | POA: Diagnosis not present

## 2023-06-05 DIAGNOSIS — Z853 Personal history of malignant neoplasm of breast: Secondary | ICD-10-CM | POA: Diagnosis not present

## 2023-06-05 DIAGNOSIS — C50412 Malignant neoplasm of upper-outer quadrant of left female breast: Secondary | ICD-10-CM

## 2023-06-05 HISTORY — DX: Malignant neoplasm of unspecified site of unspecified female breast: C50.919

## 2023-06-05 HISTORY — DX: Personal history of irradiation: Z92.3

## 2023-06-07 DIAGNOSIS — E875 Hyperkalemia: Secondary | ICD-10-CM | POA: Diagnosis not present

## 2023-06-15 DIAGNOSIS — M810 Age-related osteoporosis without current pathological fracture: Secondary | ICD-10-CM | POA: Diagnosis not present

## 2023-07-03 DIAGNOSIS — M810 Age-related osteoporosis without current pathological fracture: Secondary | ICD-10-CM | POA: Diagnosis not present

## 2023-09-18 ENCOUNTER — Other Ambulatory Visit: Payer: Self-pay | Admitting: Hematology and Oncology

## 2023-09-24 NOTE — Assessment & Plan Note (Signed)
 06/24/2022:Screening mammogram detected left breast asymmetry UOQ.  Two abnormalities, 2 o'clock position 5 mm biopsy: Grade 1 IDC with DCIS ER 100%, PR 100%, HER2 negative 1+, Ki-67 5%; additional area 9 mm biopsy: Benign, axilla negative    08/03/2022: Left lumpectomy: Grade 1 IDC 0.8 cm, low-grade DCIS, LVI not present, margins negative, ER 100%, PR 100%, HER2 1+ negative, Ki-67 5% Did not recommend Oncotype DX testing for grade 1 tumor less than 1 cm.   Adjuvant radiation: 09/13/2022-10/11/2022 Treatment plan: Adjuvant antiestrogen therapy with anastrozole  1 mg daily x 5 years Anastrozole  Toxicities:  Breast Cancer Surveillance: Breast Exam 09/25/23: Benign Mammogram and US  06/05/23: Benign, density is cat B  RTC in 1 year

## 2023-09-25 ENCOUNTER — Inpatient Hospital Stay: Payer: Medicare PPO | Attending: Hematology and Oncology | Admitting: Hematology and Oncology

## 2023-09-25 VITALS — BP 110/57 | HR 89 | Temp 98.0°F | Resp 18 | Ht 65.0 in | Wt 117.2 lb

## 2023-09-25 DIAGNOSIS — C50412 Malignant neoplasm of upper-outer quadrant of left female breast: Secondary | ICD-10-CM | POA: Insufficient documentation

## 2023-09-25 DIAGNOSIS — Z79811 Long term (current) use of aromatase inhibitors: Secondary | ICD-10-CM | POA: Diagnosis not present

## 2023-09-25 DIAGNOSIS — Z170 Estrogen receptor positive status [ER+]: Secondary | ICD-10-CM | POA: Insufficient documentation

## 2023-09-25 DIAGNOSIS — M810 Age-related osteoporosis without current pathological fracture: Secondary | ICD-10-CM | POA: Insufficient documentation

## 2023-09-25 DIAGNOSIS — Z923 Personal history of irradiation: Secondary | ICD-10-CM | POA: Diagnosis not present

## 2023-09-25 DIAGNOSIS — Z9181 History of falling: Secondary | ICD-10-CM | POA: Insufficient documentation

## 2023-09-25 NOTE — Progress Notes (Signed)
 Patient Care Team: Claudene Pellet, MD as PCP - General (Family Medicine) Tat, Asberry RAMAN, DO as Consulting Physician (Neurology) Odean Potts, MD as Consulting Physician (Hematology and Oncology) Vanderbilt Ned, MD as Consulting Physician (General Surgery) Shannon Agent, MD as Consulting Physician (Radiation Oncology)  DIAGNOSIS:  Encounter Diagnosis  Name Primary?   Malignant neoplasm of upper-outer quadrant of left breast in female, estrogen receptor positive (HCC) Yes    SUMMARY OF ONCOLOGIC HISTORY: Oncology History  Malignant neoplasm of upper-outer quadrant of left breast in female, estrogen receptor positive (HCC)  06/24/2022 Initial Diagnosis   Screening mammogram detected left breast asymmetry UOQ.  Two abnormalities, 2 o'clock position 5 mm biopsy: Grade 1 IDC with DCIS ER 100%, PR 100%, HER2 negative 1+, Ki-67 5%; additional area 9 mm biopsy: Benign, axilla negative   07/06/2022 Cancer Staging   Staging form: Breast, AJCC 8th Edition - Clinical stage from 07/06/2022: Stage IA (cT1a, cN0, cM0, G1, ER+, PR+, HER2-) - Signed by Odean Potts, MD on 07/06/2022 Stage prefix: Initial diagnosis Histologic grading system: 3 grade system    Genetic Testing   [facility] Custom Panel+RNA identified an increased risk allele in the HOXB13 gene (c.251G>A). The remaining 42 genes were Negative. Report date is 07/15/2022.  The Custom Hereditary Cancers Panel offered by [facility] includes sequencing and/or deletion duplication testing of the following 43 genes: APC, ATM, AXIN2, BAP1, BARD1, BMPR1A, BRCA1, BRCA2, BRIP1, CDH1, CDK4, CDKN2A (p14ARF and p16INK4a only), CHEK2, CTNNA1, EPCAM (Deletion/duplication testing only), FH, GREM1 (promoter region duplication testing only), HOXB13, KIT, MBD4, MEN1, MLH1, MSH2, MSH3, MSH6, MUTYH, NF1, NHTL1, PALB2, PDGFRA, PMS2, POLD1, POLE, PTEN, RAD51C, RAD51D, SMAD4, SMARCA4. STK11, TP53, TSC1, TSC2, and VHL.   08/03/2022 Oncotype testing   Left lumpectomy:  Grade 1 IDC 0.8 cm, low-grade DCIS, LVI not present, margins negative, ER 100%, PR 100%, HER2 1+ negative, Ki-67 5% Did not recommend Oncotype DX testing for grade 1 tumor less than 1 cm.   08/03/2022 Cancer Staging   Staging form: Breast, AJCC 8th Edition - Pathologic stage from 08/03/2022: Stage IA (pT1b, pN0, cM0, G1, ER+, PR+, HER2-) - Signed by Crawford Morna Pickle, NP on 12/29/2022 Stage prefix: Initial diagnosis Histologic grading system: 3 grade system   09/12/2022 - 10/05/2022 Radiation Therapy   First Treatment Date: 2022-09-12 - Last Treatment Date: 2022-10-11   Plan Name: Breast_L_BH Site: Breast, Left Technique: 3D Mode: Photon Dose Per Fraction: 2.67 Gy Prescribed Dose (Delivered / Prescribed): 40.05 Gy / 40.05 Gy Prescribed Fxs (Delivered / Prescribed): 15 / 15   Plan Name: Brst_L_Bst_BH Site: Breast, Left Technique: 3D Mode: Photon Dose Per Fraction: 2 Gy Prescribed Dose (Delivered / Prescribed): 12 Gy / 12 Gy Prescribed Fxs (Delivered / Prescribed): 6 / 6   10/2022 -  Anti-estrogen oral therapy   1 mg Anastrozole  daily x 5 years     CHIEF COMPLIANT:   HISTORY OF PRESENT ILLNESS:   History of Present Illness Michele Swanson is a 68 year old female with osteoporosis who presents for follow-up on her bone health and medication management.  She takes anastrozole  and experiences stomach discomfort and decreased appetite after taking her medications. She has not experienced hot flashes. She received a Prolia  injection a few months ago and is scheduled for another in November. Her treatment regimen includes calcium and vitamin D supplements.  She has a history of falling, including a significant fall down wooden steps resulting in a large hematoma on her back. She has not fallen recently and  is cautious to avoid falls.     ALLERGIES:  is allergic to ambien [zolpidem tartrate], diclofenac, diclofenac sodium, hydrochlorothiazide-triamterene,  hydrocodone-acetaminophen , voltaren [diclofenac sodium], and zolpidem.  MEDICATIONS:  Current Outpatient Medications  Medication Sig Dispense Refill   albuterol  (VENTOLIN  HFA) 108 (90 Base) MCG/ACT inhaler Inhale 2 puffs into the lungs every 6 (six) hours as needed for wheezing or shortness of breath. 8 g 0   amLODipine (NORVASC) 5 MG tablet Take 5 mg by mouth daily.     anastrozole  (ARIMIDEX ) 1 MG tablet Take 1 tablet (1 mg total) by mouth daily. 90 tablet 3   Ascorbic Acid (VITAMIN C) 100 MG tablet Take 100 mg by mouth daily.     Cholecalciferol (VITAMIN D3) 50 MCG (2000 UT) capsule Take 2,000 Units by mouth daily.     Eszopiclone (ESZOPICLONE) 3 MG TABS Take 3 mg by mouth at bedtime. Take immediately before bedtime     hydrochlorothiazide (HYDRODIURIL) 12.5 MG tablet Take 12.5 mg by mouth daily.     irbesartan (AVAPRO) 150 MG tablet Take 150 mg by mouth daily.     MITIGARE 0.6 MG CAPS Take by mouth as needed. Take as directed     Venlafaxine HCl 75 MG TB24 Take 75 mg by mouth in the morning and at bedtime.      vitamin B-12 (CYANOCOBALAMIN ) 1000 MCG tablet Take 1,000 mcg by mouth daily.     Vitamin D, Ergocalciferol, (DRISDOL) 1.25 MG (50000 UNIT) CAPS capsule Take 50,000 Units by mouth once a week.     No current facility-administered medications for this visit.    PHYSICAL EXAMINATION: ECOG PERFORMANCE STATUS: 1 - Symptomatic but completely ambulatory  There were no vitals filed for this visit. There were no vitals filed for this visit.  Physical Exam MUSCULOSKELETAL: Spine normal. Hips with decreased bone density.  (exam performed in the presence of a chaperone)  LABORATORY DATA:  I have reviewed the data as listed    Latest Ref Rng & Units 01/20/2023    9:10 AM 07/06/2022   11:30 AM 06/24/2019    1:34 PM  CMP  Glucose 70 - 99 mg/dL 851  892    BUN 8 - 23 mg/dL 13  19    Creatinine 9.55 - 1.00 mg/dL 9.01  9.16    Sodium 864 - 145 mmol/L 136  140    Potassium 3.5 - 5.1  mmol/L 4.6  4.7    Chloride 98 - 111 mmol/L 98  104    CO2 22 - 32 mmol/L 30  29    Calcium 8.9 - 10.3 mg/dL 89.9  9.7    Total Protein 6.5 - 8.1 g/dL  6.8  7.0   Total Bilirubin 0.3 - 1.2 mg/dL  0.6    Alkaline Phos 38 - 126 U/L  104    AST 15 - 41 U/L  17    ALT 0 - 44 U/L  16      Lab Results  Component Value Date   WBC 8.5 01/20/2023   HGB 14.5 01/20/2023   HCT 42.9 01/20/2023   MCV 111.1 (H) 01/20/2023   PLT 310 01/20/2023   NEUTROABS 6.9 01/20/2023    ASSESSMENT & PLAN:  Malignant neoplasm of upper-outer quadrant of left breast in female, estrogen receptor positive (HCC) 06/24/2022:Screening mammogram detected left breast asymmetry UOQ.  Two abnormalities, 2 o'clock position 5 mm biopsy: Grade 1 IDC with DCIS ER 100%, PR 100%, HER2 negative 1+, Ki-67 5%; additional area 9 mm  biopsy: Benign, axilla negative    08/03/2022: Left lumpectomy: Grade 1 IDC 0.8 cm, low-grade DCIS, LVI not present, margins negative, ER 100%, PR 100%, HER2 1+ negative, Ki-67 5% Did not recommend Oncotype DX testing for grade 1 tumor less than 1 cm.   Adjuvant radiation: 09/13/2022-10/11/2022 Treatment plan: Adjuvant antiestrogen therapy with anastrozole  1 mg daily x 5 years started 10/2022 Anastrozole  Toxicities: Osteoporosis: Currently on Prolia  injections along with calcium and vitamin D.  She understands importance of not following and will need a bone density every 2 years.  Otherwise she is tolerating the treatment fairly well.  She does have chronic nausea which she attributes to taking multiple medications.  Breast Cancer Surveillance:  Mammogram and US  06/05/23: Benign, density is cat B  RTC in 1 year ------------------------------------- Assessment and Plan Assessment & Plan Estrogen receptor positive breast cancer, status post treatment, on adjuvant anastrozole  On adjuvant anastrozole  therapy. Reports stomach discomfort and reduced appetite, possibly due to medication. No hot flashes.  Recent mammograms show no recurrence. - Continue anastrozole  1 mg oral daily. - Monitor for side effects such as stomach discomfort. - Continue regular mammograms as per schedule.  Age-related osteoporosis on Prolia  therapy Diagnosed with osteoporosis, confirmed by bone density testing. On Prolia  therapy, with next injection in November. Prefers injections to avoid gastrointestinal upset. Taking calcium and vitamin D supplements. Back well-managed, hips softer, increasing fracture risk if falls occur. - Administer Prolia  injection every six months, next due in November. - Continue calcium and vitamin D supplementation. - Repeat bone density testing every couple of years to monitor bone health.      No orders of the defined types were placed in this encounter.  The patient has a good understanding of the overall plan. she agrees with it. she will call with any problems that may develop before the next visit here. Total time spent: 30 mins including face to face time and time spent for planning, charting and co-ordination of care   Naomi MARLA Chad, MD 09/25/23

## 2023-12-11 DIAGNOSIS — R7303 Prediabetes: Secondary | ICD-10-CM | POA: Diagnosis not present

## 2023-12-11 DIAGNOSIS — F331 Major depressive disorder, recurrent, moderate: Secondary | ICD-10-CM | POA: Diagnosis not present

## 2023-12-11 DIAGNOSIS — M109 Gout, unspecified: Secondary | ICD-10-CM | POA: Diagnosis not present

## 2023-12-11 DIAGNOSIS — E782 Mixed hyperlipidemia: Secondary | ICD-10-CM | POA: Diagnosis not present

## 2023-12-11 DIAGNOSIS — I10 Essential (primary) hypertension: Secondary | ICD-10-CM | POA: Diagnosis not present

## 2023-12-11 DIAGNOSIS — F419 Anxiety disorder, unspecified: Secondary | ICD-10-CM | POA: Diagnosis not present

## 2023-12-11 DIAGNOSIS — Z23 Encounter for immunization: Secondary | ICD-10-CM | POA: Diagnosis not present

## 2023-12-11 DIAGNOSIS — Z0000 Encounter for general adult medical examination without abnormal findings: Secondary | ICD-10-CM | POA: Diagnosis not present

## 2023-12-11 DIAGNOSIS — E559 Vitamin D deficiency, unspecified: Secondary | ICD-10-CM | POA: Diagnosis not present

## 2024-01-04 DIAGNOSIS — Z13820 Encounter for screening for osteoporosis: Secondary | ICD-10-CM | POA: Diagnosis not present

## 2024-09-25 ENCOUNTER — Ambulatory Visit: Admitting: Hematology and Oncology
